# Patient Record
Sex: Male | Born: 1960 | Race: Black or African American | Hispanic: No | Marital: Single | Smoking: Never smoker
Health system: Southern US, Community
[De-identification: ages and names within clinical notes are randomized; demographics above are authoritative.]

## PROBLEM LIST (undated history)

## (undated) DIAGNOSIS — N529 Male erectile dysfunction, unspecified: Secondary | ICD-10-CM

## (undated) DIAGNOSIS — A6000 Herpesviral infection of urogenital system, unspecified: Secondary | ICD-10-CM

## (undated) HISTORY — DX: Herpesviral infection of urogenital system, unspecified: A60.00

## (undated) HISTORY — DX: Male erectile dysfunction, unspecified: N52.9

---

## 2021-06-16 ENCOUNTER — Other Ambulatory Visit: Payer: Self-pay

## 2021-06-16 ENCOUNTER — Other Ambulatory Visit: Payer: Self-pay | Admitting: Internal Medicine

## 2021-06-16 DIAGNOSIS — R609 Edema, unspecified: Secondary | ICD-10-CM

## 2021-06-18 ENCOUNTER — Other Ambulatory Visit: Payer: Self-pay

## 2021-06-18 ENCOUNTER — Emergency Department (HOSPITAL_COMMUNITY): Payer: 59

## 2021-06-18 ENCOUNTER — Encounter (HOSPITAL_COMMUNITY): Payer: Self-pay | Admitting: Emergency Medicine

## 2021-06-18 ENCOUNTER — Emergency Department (HOSPITAL_COMMUNITY)
Admission: EM | Admit: 2021-06-18 | Discharge: 2021-06-18 | Disposition: A | Discharge status: Home / Self Care | Payer: 59 | Source: Home / Self Care | Attending: Emergency Medicine | Admitting: Emergency Medicine

## 2021-06-18 DIAGNOSIS — K115 Sialolithiasis: Secondary | ICD-10-CM | POA: Diagnosis present

## 2021-06-18 DIAGNOSIS — Z20822 Contact with and (suspected) exposure to covid-19: Secondary | ICD-10-CM | POA: Diagnosis present

## 2021-06-18 DIAGNOSIS — I889 Nonspecific lymphadenitis, unspecified: Secondary | ICD-10-CM | POA: Insufficient documentation

## 2021-06-18 DIAGNOSIS — K1120 Sialoadenitis, unspecified: Secondary | ICD-10-CM

## 2021-06-18 DIAGNOSIS — M542 Cervicalgia: Secondary | ICD-10-CM | POA: Diagnosis not present

## 2021-06-18 DIAGNOSIS — Z87891 Personal history of nicotine dependence: Secondary | ICD-10-CM

## 2021-06-18 DIAGNOSIS — R710 Precipitous drop in hematocrit: Secondary | ICD-10-CM | POA: Diagnosis not present

## 2021-06-18 DIAGNOSIS — K1121 Acute sialoadenitis: Principal | ICD-10-CM | POA: Diagnosis present

## 2021-06-18 LAB — BASIC METABOLIC PANEL
Anion gap: 8 (ref 5–15)
BUN: 9 mg/dL (ref 6–20)
CO2: 23 mmol/L (ref 22–32)
Calcium: 8.7 mg/dL — ABNORMAL LOW (ref 8.9–10.3)
Chloride: 107 mmol/L (ref 98–111)
Creatinine, Ser: 1.04 mg/dL (ref 0.61–1.24)
GFR, Estimated: >60 mL/min (ref >60)
Glucose, Bld: 104 mg/dL — ABNORMAL HIGH (ref 70–99)
Potassium: 3.6 mmol/L (ref 3.5–5.1)
Sodium: 138 mmol/L (ref 135–145)

## 2021-06-18 LAB — CBC WITH DIFFERENTIAL/PLATELET
Abs Immature Granulocytes: 0.03 10*3/uL (ref 0.00–0.07)
Basophils Absolute: 0 10*3/uL (ref 0.0–0.1)
Basophils Relative: 0 %
Eosinophils Absolute: 0.1 10*3/uL (ref 0.0–0.5)
Eosinophils Relative: 1 %
HCT: 39 % (ref 39.0–52.0)
Hemoglobin: 12.8 g/dL — ABNORMAL LOW (ref 13.0–17.0)
Immature Granulocytes: 0 %
Lymphocytes Relative: 25 %
Lymphs Abs: 2.2 10*3/uL (ref 0.7–4.0)
MCH: 31.3 pg (ref 26.0–34.0)
MCHC: 32.8 g/dL (ref 30.0–36.0)
MCV: 95.4 fL (ref 80.0–100.0)
Monocytes Absolute: 0.9 10*3/uL (ref 0.1–1.0)
Monocytes Relative: 10 %
Neutro Abs: 5.4 10*3/uL (ref 1.7–7.7)
Neutrophils Relative %: 64 %
Platelets: 233 10*3/uL (ref 150–400)
RBC: 4.09 MIL/uL — ABNORMAL LOW (ref 4.22–5.81)
RDW: 12 % (ref 11.5–15.5)
WBC: 8.6 10*3/uL (ref 4.0–10.5)
nRBC: 0 % (ref 0.0–0.2)

## 2021-06-18 MED ORDER — HYDROMORPHONE HCL 1 MG/ML IJ SOLN
1.0000 mg | Freq: Once | INTRAMUSCULAR | Status: AC
Start: 2021-06-18 — End: 2021-06-18
  Administered 2021-06-18: 1 mg via INTRAVENOUS
  Filled 2021-06-18: qty 1

## 2021-06-18 MED ORDER — HYDROCODONE-ACETAMINOPHEN 7.5-325 MG PO TABS: 1.0000 | ORAL_TABLET | Freq: Four times a day (QID) | ORAL | 0 refills | Status: DC | PRN

## 2021-06-18 MED ORDER — SODIUM CHLORIDE 0.9 % IV BOLUS
500.0000 mL | Freq: Once | INTRAVENOUS | Status: AC
  Administered 2021-06-18: 500 mL via INTRAVENOUS

## 2021-06-18 MED ORDER — MORPHINE SULFATE (PF) 4 MG/ML IV SOLN
4.0000 mg | Freq: Once | INTRAVENOUS | Status: AC
Start: 2021-06-18 — End: 2021-06-18
  Administered 2021-06-18: 4 mg via INTRAVENOUS
  Filled 2021-06-18: qty 1

## 2021-06-18 MED ORDER — AMOXICILLIN-POT CLAVULANATE 875-125 MG PO TABS: 1.0000 | ORAL_TABLET | Freq: Two times a day (BID) | ORAL | 0 refills | Status: DC

## 2021-06-18 MED ORDER — HYDROMORPHONE HCL 1 MG/ML IJ SOLN
1.0000 mg | Freq: Once | INTRAMUSCULAR | Status: AC
  Administered 2021-06-18: 1 mg via INTRAVENOUS
  Filled 2021-06-18: qty 1

## 2021-06-18 MED ORDER — IOHEXOL 300 MG/ML  SOLN
75.0000 mL | Freq: Once | INTRAMUSCULAR | Status: AC | PRN
  Administered 2021-06-18: 75 mL via INTRAVENOUS

## 2021-06-18 NOTE — ED Triage Notes (Signed)
Patient reports swelling of right neck lymph node onset 2 months ago with right ear ache , no fever or chills .

## 2021-06-18 NOTE — ED Provider Notes (Signed)
[facility] EMERGENCY DEPARTMENT Provider Note   CSN: [id number] Arrival date & time: 06/18/21  0600     History Chief Complaint  Patient presents with   Lymphadenitis    Dustin Powell is a 60 y.o. male.  HPI  60 year old male presents the emergency department with swelling underneath the right jaw.  Patient states that this started a week ago on Friday, the swelling has gotten worse, more tender.  He is now having pain radiating to his lower jaw/teeth, right ear, neck and temple area.  Denies any recent dental infection.  No recent fever.  He states it is painful to swallow but does not feel any particular throat swelling.  History reviewed. No pertinent past medical history.  There are no problems to display for this patient.   History reviewed. No pertinent surgical history.     No family history on file.  Social History   Tobacco Use   Smoking status: Never   Smokeless tobacco: Never  Substance Use Topics   Alcohol use: Yes   Drug use: Never    Home Medications Prior to Admission medications   Not on File    Allergies    Patient has no known allergies.  Review of Systems   Review of Systems  Constitutional:  Positive for appetite change. Negative for chills and fever.  HENT:  Positive for ear pain, sore throat and trouble swallowing. Negative for congestion, dental problem and voice change.   Eyes:  Negative for visual disturbance.  Respiratory:  Negative for shortness of breath.   Cardiovascular:  Negative for chest pain.  Gastrointestinal:  Negative for abdominal pain, diarrhea and vomiting.  Genitourinary:  Negative for dysuria.  Skin:  Negative for rash.  Neurological:  Negative for headaches.   Physical Exam Updated Vital Signs BP 136/88   Pulse 65   Temp 98 F (36.7 C) (Oral)   Resp 16   Ht 5\' 6"  (1.676 m)   Wt 80 kg   SpO2 94%   BMI 28.47 kg/m   Physical Exam Vitals and nursing note reviewed.  Constitutional:       Appearance: Normal appearance.  HENT:     Head: Normocephalic.     Mouth/Throat:     Mouth: Mucous membranes are moist.     Comments: Poor dentition throughout the entire mouth Eyes:     Pupils: Pupils are equal, round, and reactive to light.  Neck:     Comments: Right submandibular firm tender swelling extending from the jawline to about the mid neck, no stridor Cardiovascular:     Rate and Rhythm: Normal rate.  Pulmonary:     Effort: Pulmonary effort is normal. No respiratory distress.  Abdominal:     Palpations: Abdomen is soft.     Tenderness: There is no abdominal tenderness.  Skin:    General: Skin is warm.  Neurological:     Mental Status: He is alert and oriented to person, place, and time. Mental status is at baseline.  Psychiatric:        Mood and Affect: Mood normal.    ED Results / Procedures / Treatments   Labs (all labs ordered are listed, but only abnormal results are displayed) Labs Reviewed  CBC WITH DIFFERENTIAL/PLATELET - Abnormal; Notable for the following components:      Result Value   RBC 4.09 (*)    Hemoglobin 12.8 (*)    All other components within normal limits  BASIC METABOLIC PANEL - Abnormal; Notable  for the following components:   Glucose, Bld 104 (*)    Calcium 8.7 (*)    All other components within normal limits    EKG None  Radiology No results found.  Procedures Procedures   Medications Ordered in ED Medications  sodium chloride 0.9 % bolus 500 mL (500 mLs Intravenous New Bag/Given 06/18/21 1013)  morphine 4 MG/ML injection 4 mg (4 mg Intravenous Given 06/18/21 1013)    ED Course  I have reviewed the triage vital signs and the nursing notes.  Pertinent labs & imaging results that were available during my care of the patient were reviewed by me and considered in my medical decision making (see chart for details).    MDM Rules/Calculators/A&P                           60 year old male presents the emergency department  with right submandibular swelling.  Vitals are stable, blood work is reassuring.  CT scan shows a large submandibular gland stone at 1.9 cm with associated sialadenitis.  Patient has not have any specialized follow-up for this.  Spoke with on-call ENT Dr. [name] who recommends antibiotics and pain control and outpatient follow-up in the office for possible stone removal/plan removal.  Patient and spouse agree with this discharge plan.  Consulted with pharmacy for appropriate coverage for sialadenitis, we will prescribe Augmentin to start.  Patient at this time appears safe and stable for discharge and will be treated as an outpatient.  Discharge plan and strict return to ED precautions discussed, patient verbalizes understanding and agreement.  Final Clinical Impression(s) / ED Diagnoses Final diagnoses:  None    Rx / DC Orders ED Discharge Orders     None        Rozelle Logan, DO 06/18/21 1520

## 2021-06-18 NOTE — Discharge Instructions (Addendum)
You have been seen and discharged from the emergency department.  Take antibiotic as directed, take pain medicine as needed.  Do not mix this medication with alcohol or other sedating medications. Do not drive or do heavy physical activity and to know how this medication affects you.  It may cause drowsiness.  Follow-up with Dr. [name], ENT, for reevaluation and further care. Take home medications as prescribed. If you have any worsening symptoms or further concerns for your health please return to an emergency department for further evaluation.

## 2021-06-19 ENCOUNTER — Encounter (HOSPITAL_COMMUNITY): Payer: Self-pay | Admitting: Emergency Medicine

## 2021-06-19 ENCOUNTER — Inpatient Hospital Stay (HOSPITAL_COMMUNITY)
Admission: EM | Admit: 2021-06-19 | Discharge: 2021-06-22 | DRG: 155 | Disposition: A | Discharge status: Home / Self Care | Payer: 59 | Attending: Family Medicine | Admitting: Family Medicine

## 2021-06-19 DIAGNOSIS — K1120 Sialoadenitis, unspecified: Secondary | ICD-10-CM | POA: Diagnosis not present

## 2021-06-19 DIAGNOSIS — K1121 Acute sialoadenitis: Secondary | ICD-10-CM | POA: Diagnosis present

## 2021-06-19 DIAGNOSIS — R710 Precipitous drop in hematocrit: Secondary | ICD-10-CM | POA: Diagnosis not present

## 2021-06-19 DIAGNOSIS — K115 Sialolithiasis: Secondary | ICD-10-CM

## 2021-06-19 DIAGNOSIS — Z87891 Personal history of nicotine dependence: Secondary | ICD-10-CM | POA: Diagnosis not present

## 2021-06-19 DIAGNOSIS — M542 Cervicalgia: Secondary | ICD-10-CM

## 2021-06-19 DIAGNOSIS — R509 Fever, unspecified: Secondary | ICD-10-CM

## 2021-06-19 DIAGNOSIS — R221 Localized swelling, mass and lump, neck: Secondary | ICD-10-CM | POA: Diagnosis not present

## 2021-06-19 DIAGNOSIS — Z20822 Contact with and (suspected) exposure to covid-19: Secondary | ICD-10-CM | POA: Diagnosis present

## 2021-06-19 LAB — CBC WITH DIFFERENTIAL/PLATELET
Abs Immature Granulocytes: 0.07 10*3/uL (ref 0.00–0.07)
Basophils Absolute: 0 10*3/uL (ref 0.0–0.1)
Basophils Relative: 0 %
Eosinophils Absolute: 0 10*3/uL (ref 0.0–0.5)
Eosinophils Relative: 0 %
HCT: 41.2 % (ref 39.0–52.0)
Hemoglobin: 14.1 g/dL (ref 13.0–17.0)
Immature Granulocytes: 1 %
Lymphocytes Relative: 11 %
Lymphs Abs: 1.5 10*3/uL (ref 0.7–4.0)
MCH: 31.8 pg (ref 26.0–34.0)
MCHC: 34.2 g/dL (ref 30.0–36.0)
MCV: 93 fL (ref 80.0–100.0)
Monocytes Absolute: 1 10*3/uL (ref 0.1–1.0)
Monocytes Relative: 7 %
Neutro Abs: 11.6 10*3/uL — ABNORMAL HIGH (ref 1.7–7.7)
Neutrophils Relative %: 81 %
Platelets: 260 10*3/uL (ref 150–400)
RBC: 4.43 MIL/uL (ref 4.22–5.81)
RDW: 11.9 % (ref 11.5–15.5)
WBC: 14.2 10*3/uL — ABNORMAL HIGH (ref 4.0–10.5)
nRBC: 0 % (ref 0.0–0.2)

## 2021-06-19 LAB — BASIC METABOLIC PANEL
Anion gap: 12 (ref 5–15)
BUN: 11 mg/dL (ref 6–20)
CO2: 22 mmol/L (ref 22–32)
Calcium: 9.2 mg/dL (ref 8.9–10.3)
Chloride: 102 mmol/L (ref 98–111)
Creatinine, Ser: 1.01 mg/dL (ref 0.61–1.24)
GFR, Estimated: >60 mL/min (ref >60)
Glucose, Bld: 103 mg/dL — ABNORMAL HIGH (ref 70–99)
Potassium: 3.8 mmol/L (ref 3.5–5.1)
Sodium: 136 mmol/L (ref 135–145)

## 2021-06-19 LAB — RESP PANEL BY RT-PCR (FLU A&B, COVID) ARPGX2
Influenza A by PCR: NEGATIVE
Influenza B by PCR: NEGATIVE
SARS Coronavirus 2 by RT PCR: NEGATIVE

## 2021-06-19 LAB — HIV ANTIBODY (ROUTINE TESTING W REFLEX): HIV Screen 4th Generation wRfx: NONREACTIVE

## 2021-06-19 MED ORDER — SODIUM CHLORIDE 0.9 % IV SOLN: Freq: Once | INTRAVENOUS | Status: AC

## 2021-06-19 MED ORDER — ACETAMINOPHEN 160 MG/5ML PO SOLN: 650.0000 mg | Freq: Four times a day (QID) | ORAL | Status: DC

## 2021-06-19 MED ORDER — HYDROMORPHONE HCL 1 MG/ML IJ SOLN
2.0000 mg | Freq: Four times a day (QID) | INTRAMUSCULAR | Status: DC | PRN
  Administered 2021-06-19: 2 mg via INTRAVENOUS
  Filled 2021-06-19: qty 2

## 2021-06-19 MED ORDER — SODIUM CHLORIDE 0.9 % IV SOLN: INTRAVENOUS | Status: DC

## 2021-06-19 MED ORDER — SODIUM CHLORIDE 0.9 % IV BOLUS
1000.0000 mL | Freq: Once | INTRAVENOUS | Status: AC
  Administered 2021-06-19: 1000 mL via INTRAVENOUS

## 2021-06-19 MED ORDER — HYDROMORPHONE HCL 1 MG/ML IJ SOLN
INTRAMUSCULAR | Status: AC
  Administered 2021-06-19: 2 mg
  Filled 2021-06-19: qty 2

## 2021-06-19 MED ORDER — ACETAMINOPHEN 160 MG/5ML PO SOLN
650.0000 mg | Freq: Four times a day (QID) | ORAL | Status: DC | PRN
  Administered 2021-06-20 – 2021-06-21 (×4): 650 mg via ORAL
  Filled 2021-06-19 (×4): qty 20.3

## 2021-06-19 MED ORDER — SODIUM CHLORIDE 0.9 % IV SOLN
3.0000 g | Freq: Four times a day (QID) | INTRAVENOUS | Status: DC
  Administered 2021-06-19 – 2021-06-22 (×11): 3 g via INTRAVENOUS
  Filled 2021-06-19 (×11): qty 8

## 2021-06-19 MED ORDER — ENOXAPARIN SODIUM 40 MG/0.4ML IJ SOSY
40.0000 mg | PREFILLED_SYRINGE | INTRAMUSCULAR | Status: DC
  Administered 2021-06-19 – 2021-06-21 (×3): 40 mg via SUBCUTANEOUS
  Filled 2021-06-19 (×3): qty 0.4

## 2021-06-19 MED ORDER — AMOXICILLIN-POT CLAVULANATE 875-125 MG PO TABS
1.0000 | ORAL_TABLET | Freq: Two times a day (BID) | ORAL | Status: DC
  Administered 2021-06-19: 1 via ORAL
  Filled 2021-06-19: qty 1

## 2021-06-19 MED ORDER — ONDANSETRON HCL 4 MG/2ML IJ SOLN
4.0000 mg | Freq: Once | INTRAMUSCULAR | Status: AC
  Administered 2021-06-19: 4 mg via INTRAVENOUS
  Filled 2021-06-19: qty 2

## 2021-06-19 MED ORDER — HYDROMORPHONE HCL 1 MG/ML IJ SOLN
2.0000 mg | INTRAMUSCULAR | Status: DC | PRN
  Administered 2021-06-19: 2 mg via INTRAVENOUS
  Filled 2021-06-19: qty 2

## 2021-06-19 MED ORDER — HYDROMORPHONE HCL 1 MG/ML IJ SOLN
1.0000 mg | Freq: Once | INTRAMUSCULAR | Status: AC
  Administered 2021-06-19: 1 mg via INTRAVENOUS
  Filled 2021-06-19: qty 1

## 2021-06-19 MED ORDER — ACETAMINOPHEN 325 MG PO TABS
650.0000 mg | ORAL_TABLET | Freq: Four times a day (QID) | ORAL | Status: DC | PRN
  Filled 2021-06-19: qty 2

## 2021-06-19 NOTE — Progress Notes (Signed)
Family Medicine Teaching Service Daily Progress Note Intern Pager: [phone]  Patient name: Dustin Powell Medical record number: [id number] Date of birth: [birth year] Age: 60 y.o. Gender: male  Primary Care Provider: Default, Provider, MD Consultants: ENT   Code Status: Full Code  Pt Overview and Major Events to Date:  9/24-admitted  Assessment and Plan: Dustin Powell is a 60 y.o. male presenting with 2 months of jaw pain found to have sialadenitis admitted for pain control and inability tolerate oral intake. PMHx significant for: None.  Sialadenitis 1.9 cm submandibular stone seen on CT. Awaiting ENT intervention tomorrow.  Pain is well controlled on current regimen. - appreciate ENT involvement - IV ampicillin-sulbactam - IV hydromorphone 2 mg every 4 hours as needed - Acetaminophen oral solution prn - mIVF - reached out to RN to perform bedside swallow  Upper respiratory symptoms Symptoms of cough, congestion, rhinorrhea.  COVID-negative.  Airborne precautions discontinued.  He reports cough is now productive.  Fever With leukocytosis WBC 14.6, no significant change from yesterday.  May be infected sialadenitis or pneumonia given productive cough.  Ordering cultures and CXR. - blood cx - urine cx - CXR - will reach out to ENT  Tachycardia He had brief episode of tachycardia with HR 120 seen on monitors.  There was 1 PVC seen on the strip.  Suspect this may be related to underlying fever.  We will continue to monitor. - cardiac monitoring  Alcohol use Reportedly drinks 12-18 beers every other weekend.  States his last drink was last weekend and he had 2 beers.  Advised to be careful with his alcohol intake. - monitor CIWA, can consider discontinuing - consider TOC consult  FEN/GI: NPO, consider advancing if passes bedside swallow PPx: Enoxaparin  Disposition: MedSurg, dispo pending ENT evaluation  Subjective:  Patient was febrile to 102.3 F 2 hours prior.  He was  given Tylenol.  Patient states he was unaware that he was having a fever.  He reports he is feeling overall better as far as pain control.  He was able to take the Tylenol and this did help with the pain.  He states he is still having some congestion and runny nose and that his cough is now productive.  Denies SOB.  Objective: Temp:  [99 F (37.2 C)-102.9 F (39.4 C)] 102.9 F (39.4 C) (09/25 0440) Pulse Rate:  [56-98] 94 (09/25 0440) Resp:  [16-20] 18 (09/24 2123) BP: (112-160)/(54-103) 129/81 (09/25 0440) SpO2:  [91 %-99 %] 92 % (09/25 0440) Physical Exam: General: Alert, NAD HEENT: MMM, there is submandibular swelling and tenderness on the right side, airway is intact Cardiovascular: Tachycardic, regular rhythm, no murmurs Respiratory: Clear to auscultation bilaterally, no respiratory distress Abdomen: Soft, nontender, positive bowel sounds Extremities: Warm and well perfused, no edema  Laboratory: Recent Labs  Lab 06/18/21 0618 06/19/21 0826 06/20/21 0156  WBC 8.6 14.2* 14.6*  HGB 12.8* 14.1 12.8*  HCT 39.0 41.2 38.4*  PLT 233 260 234   Recent Labs  Lab 06/18/21 0618 06/19/21 0826 06/20/21 0156  NA 138 136 137  K 3.6 3.8 3.8  CL 107 102 103  CO2 23 22 24   BUN 9 11 12   CREATININE 1.04 1.01 1.13  CALCIUM 8.7* 9.2 8.6*  GLUCOSE 104* 103* 111*      Imaging/Diagnostic Tests: CXR pending  , MD 06/20/2021, 6:33 AM PGY-2, [facility] FPTS Intern pager: [phone], text pages welcome

## 2021-06-19 NOTE — H&P (Addendum)
Family Medicine Teaching [facility] Admission History and Physical Service Pager: [phone]  Patient name: Dustin Powell Medical record number: [id number] Date of birth: 24-Sep-1961 Age: 60 y.o. Gender: male  Primary Care Provider: Default, Provider, MD Consultants: ENT Code Status: Full  Preferred Emergency Contact: Renelda Loma (fiance) [phone] Contact Information     Name Relation Home Work Mobile   baldwin,sherri Significant other   [phone]       Chief Complaint: Jaw Pain   Assessment and Plan: Dustin Powell is a 60 y.o. male presenting with jaw pain onset approximately 2 months ago. Patient has no relevant PMHx.   Jaw Pain 2/2 submandibular stone and sialadenitis  Pain and swelling in the right jaw began 2 months ago and worsened over the last week. The pt went to the ED on 9/23 for pain and swelling. He was diagnosed with a 1.9 cm submandibular stone with associated sialadenitis without abscess formation on CT scan. On-call ENT, Dr. [name], was called at that time and recommended antibiotics and pain control as well as outpatient follow-up for stone removal due to the nonemergent nature of this problem.  ENT will see the patient on Monday and recommended that primary team does not need to notify ENT unless acute changes occur as they are aware of the pt, per ED provider.  After visit to the ED yesterday, the patient was given Augmentin.  Patient returned to the ED today 9/24 because he could not tolerate any medications that were given to him and could not take anything p.o. secondary to pain.  Patient has remained afebrile and vital signs have been relatively stable.  Electrolytes are within normal limits, WBC 14.2 from 8.6.   In the ED, he received dilaudid 1mg  x2 with last dose at 1100, zofran, and a bolus of NS. Will admit for IV antibiotics and pain control until potential ENT intervention.  -Admit to med-surg, Attending Dr.  -ENT consulted, appreciate  recommendations -VS per floor protocol  -Dilaudid 2mg  for pain q6hr PRN -Unasyn per pharmacy  -Tylenol scheduled q6hr  -SLP to eval and treat  -Maintenance fluids at 100 mL/hour -Cardiac monitoring -NPO pending SLP eval -continue to monitor clinically   Leukocytosis WBC 14.2 on admission. Likely etiology seems to be due to sialadenitis. Reassuringly patient remains afebrile. -monitor am CBC -consider blood and urine cx along with CXR if becomes febrile   Upper Respiratory Symptoms  Patient has had rhinorrhea, congestion, and cough that started today.  He has had no sick contacts.  Denies any sore throat, shortness of breath.  Patient is afebrile.  -COVID/resp panel pending -Airborne and contact precautions  Alcohol use Patient reports that he drinks 12-18 beers over every other weekend.  We will monitor this.  -monitor CIWAs -Possibly contact TOC for resources if warranted   FEN/GI: NPO pending SLP eval Prophylaxis: Lovenox   Disposition: admit to med surg, attending Dr. Lum Babe   History of Present Illness:  Dustin Powell is a 60 y.o. male presenting with right sided jaw pain onset 2 months ago and worsening over the last week.  He is also endorsing rhinorrhea, congestion and cough onset over the last day. He noticed a neck mass that started about 2 months ago, and it has been growing steadily larger since then but much worse over this past week. Denies any dyspnea. Previously saw PCP in [place] who sent him to get imaging. PCP recommended ibuprofen for pain, over the last few weeks he has been taking 4 pills  a day. He has not eaten in 2 days due to the pain which he rates a 9/10. He is able to drink appropriately. Describes the pain as intense, sharp pain that radiates to his right ear. Former smoker but quit 3 years ago. Denies nausea, vomiting, diarrhea, constipation, sore throat. Came to the ED for evaluation regarding similar complaint yesterday, they recommended more fluids  and rest. He was also given prescription for pain medication. He returned today to the ED due to inability to eat from the pain. Denies hemoptysis or drainage from the area. Endorses that he feels better after coming to the hospital. Denies any recent sick contacts. Previously smoked a pack every 2-3 days, for 3-4 years. Occasional consumption of alcohol on weekends or special occasions, drinks about a 12 or 18 pack of beer about every other weekend. Denies illicit drug use.   Review Of Systems: Per HPI with the following additions:   Review of Systems  Constitutional:  Negative for chills.       Subjective fever  HENT:  Positive for congestion, ear pain and trouble swallowing. Negative for voice change.        Radiating right ear pain from right jaw into the cheek    Respiratory:  Positive for cough. Negative for shortness of breath.   Cardiovascular:  Negative for chest pain.  Gastrointestinal:  Negative for abdominal pain.  Neurological:  Positive for headaches (right sided HA).    Patient Active Problem List   Diagnosis Date Noted   Neck mass 06/19/2021    Past Medical History: History reviewed. No pertinent past medical history.  Past Surgical History: History reviewed. No pertinent surgical history.  Social History: Social History   Tobacco Use   Smoking status: Never   Smokeless tobacco: Never  Substance Use Topics   Alcohol use: Yes   Drug use: Never   Additional social history: Previously smoked a pack every 2-3 days, for 3-4 years. Occasional consumption of alcohol on weekends or special occasions, drinks about a 12 or 18 pack of beer about every other weekend. Denies illicit drug use.   Please also refer to relevant sections of EMR.  Family History: No family history on file. (None)  Allergies and Medications: No Known Allergies No current facility-administered medications on file prior to encounter.   Current Outpatient Medications on File Prior to Encounter   Medication Sig Dispense Refill   amoxicillin-clavulanate (AUGMENTIN) 875-125 MG tablet Take 1 tablet by mouth every 12 (twelve) hours. 14 tablet 0   HYDROcodone-acetaminophen (NORCO) 7.5-325 MG tablet Take 1 tablet by mouth every 6 (six) hours as needed for up to 5 days for moderate pain. 20 tablet 0    Objective: BP 133/82 (BP Location: Left Arm)   Pulse 71   Temp 99.7 F (37.6 C) (Oral)   Resp 18   SpO2 95%  Physical Exam Constitutional:      General: He is not in acute distress. HENT:     Ears:     Comments: Pain in the right ear radiating from the right chin to cheek. Right sided submandibular swelling.     Nose: Congestion and rhinorrhea present.  Eyes:     Pupils: Pupils are equal, round, and reactive to light.  Cardiovascular:     Rate and Rhythm: Normal rate and regular rhythm.     Pulses: Normal pulses.     Heart sounds: No murmur heard. Pulmonary:     Effort: Pulmonary effort is normal. No respiratory distress.  Breath sounds: Normal breath sounds.  Abdominal:     General: Bowel sounds are normal. There is no distension.     Palpations: Abdomen is soft.     Tenderness: There is no abdominal tenderness.  Skin:    General: Skin is warm.  Neurological:     Mental Status: He is alert and oriented to person, place, and time.  Psychiatric:        Mood and Affect: Mood normal.        Behavior: Behavior normal.     Labs and Imaging: CBC BMET  Recent Labs  Lab 06/19/21 0826  WBC 14.2*  HGB 14.1  HCT 41.2  PLT 260   Recent Labs  Lab 06/19/21 0826  NA 136  K 3.8  CL 102  CO2 22  BUN 11  CREATININE 1.01  GLUCOSE 103*  CALCIUM 9.2     CT soft tissue neck: IMPRESSION: 1. Large right submandibular stone measuring up to 1.9 cm with multiple additional smaller stones. There is associated enlargement and heterogeneous enhancement of the gland with surrounding inflammatory change and free fluid consistent with sialadenitis. 2. No evidence of abscess  formation in the neck. 3. Extensive dental disease as above. Recommend nonemergent dental consultation.     Alfredo Martinez, MD 06/19/2021, 3:00 PM PGY-1, [facility] FPTS Intern pager: [phone], text pages welcome  I was personally present and performed or re-performed the history, physical exam and medical decision making activities of this service and have verified that the service and findings are accurately documented in the resident's note. My edits are noted within the note. Please also see attending's attestation and notes.  Reece Leader, DO                  06/19/2021, 5:13 PM  PGY-2, [facility]

## 2021-06-19 NOTE — ED Notes (Signed)
C/o swelling to the right lower jaw, states he was seen her yest for same and has been taking medication as directed however states its not getting better.

## 2021-06-19 NOTE — Progress Notes (Addendum)
Pharmacy Antibiotic Note  Dustin Powell is a 60 y.o. male admitted on 06/19/2021 with  tonsil stone .  Pharmacy has been consulted for unasyn dosing.  Had pain/swelling in R jaw beginning 2 months ago and worsening over last week. Diagnosed with 1.9 cm submandibular stone with associated sialadenitis (w/o abscess) - was started on augmentin yesterday but unable to tolerate PO medications. WBC 14.2, Scr 1.01 (CrCl 77 mL/min). Tmax 99.   Plan: Unasyn 3g IV every 6 hours Monitor renal fx, cx results, clinical pic, and LOT     Temp (24hrs), Avg:99.4 F (37.4 C), Min:99 F (37.2 C), Max:99.9 F (37.7 C)  Recent Labs  Lab 06/18/21 0618 06/19/21 0826  WBC 8.6 14.2*  CREATININE 1.04 1.01    Estimated Creatinine Clearance: 77.3 mL/min (by C-G formula based on SCr of 1.01 mg/dL).    No Known Allergies  Antimicrobials this admission: Unasyn 9/24 >>   Dose adjustments this admission: N/A  Microbiology results: 9/24 COVID PCR: neg   Thank you for allowing pharmacy to be a part of this patient's care.  Sherron Monday, PharmD, BCCCP Clinical Pharmacist  Phone: [phone] 06/19/2021 5:30 PM  Please check AMION for all [facility] Pharmacy phone numbers After 10:00 PM, call Main Pharmacy [phone]

## 2021-06-19 NOTE — ED Notes (Signed)
States it still hurts however pain is much more tolerable at present.

## 2021-06-19 NOTE — ED Notes (Signed)
Given supplemental O2 after receiving dilaudid, and while sleeping. Alert, NAD, calm. Reports pain improved. Denies nausea or other sx. CBIR. Pending bed assignment.

## 2021-06-19 NOTE — ED Notes (Signed)
Pt did not tolerate PO medication. Pt began dry heaving which increased pain. Was able to keep antibiotic down at this time

## 2021-06-19 NOTE — ED Triage Notes (Signed)
Pt continues to have swelling to his neck swelling.  No fever, hurts to swallow but able to speak in complete sentences.

## 2021-06-19 NOTE — ED Provider Notes (Signed)
Emergency Medicine Provider Triage Evaluation Note  Dustin Powell , a 60 y.o. male  was evaluated in triage.  Pt complains of persistent pain under his R jaw. Diagnosed with 1.9cm salivary stone yesterday. Discharged with Augmentin and pain medication, but states that it is difficult to swallow these medications due to pain. No fevers since discharge. Was given instruction to follow up with Dr. [name], but never contacted the office today for follow up.  Review of Systems  Positive: R submandibular swelling Negative: Fever  Physical Exam  BP (!) 175/93 (BP Location: Left Arm)   Pulse 82   Temp 99.1 F (37.3 C)   Resp 17   SpO2 97%  Gen:   Awake, no distress   Resp:  Normal effort  MSK:   Moves extremities without difficulty  Other:  R submandibular swelling. Tolerating secretions. Normal phonation.  Medical Decision Making  Medically screening exam initiated at 12:35 AM.  Appropriate orders placed.  Oleg Oleson was informed that the remainder of the evaluation will be completed by another provider, this initial triage assessment does not replace that evaluation, and the importance of remaining in the ED until their evaluation is complete.  Odynophagia; R salivary stone   Antony Madura, PA-C 06/19/21 3419    Marily Memos, MD 06/20/21 0030

## 2021-06-19 NOTE — ED Provider Notes (Signed)
[facility] EMERGENCY DEPARTMENT Provider Note   CSN: [id number] Arrival date & time: 06/18/21  2346     History Chief Complaint  Patient presents with   Neck Pain    Dustin Powell is a 60 y.o. male.  HPI   60 year old male presents to the ER with worsening right sided neck pain and swelling.  I saw this patient yesterday in the ER when he was diagnosed with a large right-sided salivary gland stone with sialoadenitis.  ENT recommended antibiotics, pain control and outpatient follow-up.  Patient states since being home he is been unable to tolerate the medications and anything p.o. secondary to pain.  Denies any fever.  Denies any throat swelling or voice change.  History reviewed. No pertinent past medical history.  There are no problems to display for this patient.   History reviewed. No pertinent surgical history.     No family history on file.  Social History   Tobacco Use   Smoking status: Never   Smokeless tobacco: Never  Substance Use Topics   Alcohol use: Yes   Drug use: Never    Home Medications Prior to Admission medications   Medication Sig Start Date End Date Taking? Authorizing Provider  amoxicillin-clavulanate (AUGMENTIN) 875-125 MG tablet Take 1 tablet by mouth every 12 (twelve) hours. 06/18/21   Kiani Wurtzel, Clabe Seal, DO  HYDROcodone-acetaminophen (NORCO) 7.5-325 MG tablet Take 1 tablet by mouth every 6 (six) hours as needed for up to 5 days for moderate pain. 06/18/21 06/23/21  Estanislao Harmon, Clabe Seal, DO    Allergies    Patient has no known allergies.  Review of Systems   Review of Systems  Constitutional:  Negative for chills and fever.  HENT:  Negative for congestion and voice change.        Throat pain and submandibular swelling  Eyes:  Negative for visual disturbance.  Respiratory:  Negative for shortness of breath.   Cardiovascular:  Negative for chest pain.  Gastrointestinal:  Negative for abdominal pain, diarrhea and vomiting.   Genitourinary:  Negative for dysuria.  Skin:  Negative for rash.  Neurological:  Negative for headaches.   Physical Exam Updated Vital Signs BP 138/88   Pulse 80   Temp 99 F (37.2 C) (Oral)   Resp 16   SpO2 95%   Physical Exam Vitals and nursing note reviewed.  Constitutional:      Appearance: Normal appearance.  HENT:     Head: Normocephalic.     Mouth/Throat:     Mouth: Mucous membranes are moist.     Comments: No intraoral swelling, no difficulty swallowing secretions Neck:     Comments: Right submandibular swelling that is firm and very tender to touch, no stridor Cardiovascular:     Rate and Rhythm: Normal rate.  Pulmonary:     Effort: Pulmonary effort is normal. No respiratory distress.  Abdominal:     Palpations: Abdomen is soft.     Tenderness: There is no abdominal tenderness.  Skin:    General: Skin is warm.  Neurological:     Mental Status: He is alert and oriented to person, place, and time. Mental status is at baseline.  Psychiatric:        Mood and Affect: Mood normal.    ED Results / Procedures / Treatments   Labs (all labs ordered are listed, but only abnormal results are displayed) Labs Reviewed  CBC WITH DIFFERENTIAL/PLATELET - Abnormal; Notable for the following components:  Result Value   WBC 14.2 (*)    Neutro Abs 11.6 (*)    All other components within normal limits  BASIC METABOLIC PANEL - Abnormal; Notable for the following components:   Glucose, Bld 103 (*)    All other components within normal limits    EKG None  Radiology CT Soft Tissue Neck W Contrast  Result Date: 06/18/2021 CLINICAL DATA:  Right neck swelling and pain for 2 weeks EXAM: CT NECK WITH CONTRAST TECHNIQUE: Multidetector CT imaging of the neck was performed using the standard protocol following the bolus administration of intravenous contrast. CONTRAST:  21mL OMNIPAQUE IOHEXOL 300 MG/ML  SOLN COMPARISON:  None. FINDINGS: Pharynx and larynx: The nasal cavity and  nasopharynx are normal. There is partial effacement of the right oropharyngeal airway by the below described sialolithiasis/sialadenitis. There is no mucosal lesion identified. The oral cavity and oropharynx are otherwise unremarkable. The hypopharynx and larynx are unremarkable. The parapharyngeal spaces are unremarkable. Salivary glands: There is a 1.9 cm x 1.7 cm by 1.6 cm sialolith in the right submandibular gland. Multiple additional smaller stones are also seen within the right submandibular gland. The submandibular gland is enlarged and heterogeneously enhancing compared to the right. There is no appreciable enlargement of the submandibular duct distally. There is mild inflammatory change and free fluid in the overlying/surrounding soft tissues. The parotid glands and left submandibular gland are unremarkable. Thyroid: Unremarkable. Lymph nodes: There is a 0.7 cm right level IB lymph node and a 1.3 cm right level IIA lymph node, nonspecific but likely reactive. There are scattered additional subcentimeter cervical chain lymph nodes bilaterally without evidence of pathologic lymphadenopathy. Vascular: The vasculature is unremarkable. Limited intracranial: The imaged portions of the intracranial compartment are unremarkable. Visualized orbits: Not evaluated. Mastoids and visualized paranasal sinuses: The imaged paranasal sinuses are clear. The imaged mastoid air cells are clear. Skeleton: There is mild multilevel degenerative change of the cervical spine, most advanced at C4-C5 and C5-C6. There is advanced degenerative change of the left temporomandibular joint. Upper chest: The lung apices are clear. Other: There is extensive dental disease with prominent destructive change involving the right maxillary alveolar ridge. There are dental caries and periapical lucencies involving nearly all remaining molars. IMPRESSION: 1. Large right submandibular stone measuring up to 1.9 cm with multiple additional smaller  stones. There is associated enlargement and heterogeneous enhancement of the gland with surrounding inflammatory change and free fluid consistent with sialadenitis. 2. No evidence of abscess formation in the neck. 3. Extensive dental disease as above. Recommend nonemergent dental consultation. Electronically Signed   By: Lesia Hausen M.D.   On: 06/18/2021 12:39    Procedures Procedures   Medications Ordered in ED Medications  sodium chloride 0.9 % bolus 1,000 mL (0 mLs Intravenous Stopped 06/19/21 0924)  HYDROmorphone (DILAUDID) injection 1 mg (1 mg Intravenous Given 06/19/21 0822)  ondansetron (ZOFRAN) injection 4 mg (4 mg Intravenous Given 06/19/21 7616)    ED Course  I have reviewed the triage vital signs and the nursing notes.  Pertinent labs & imaging results that were available during my care of the patient were reviewed by me and considered in my medical decision making (see chart for details).    MDM Rules/Calculators/A&P                           60 year old male presents with ongoing neck pain and difficulty swallowing 2/2 large right submandibular salivary gland stone and  sialadenitis. He was discharged yesterday for possible outpatient treatment but has bee unable to tolerate pills and control pain. VSS, swelling seems comparable to when I saw him yesterday. Airway appears unaffected. Plan for ENT re consultation and medical admission.   Final Clinical Impression(s) / ED Diagnoses Final diagnoses:  None    Rx / DC Orders ED Discharge Orders     None        Rozelle Logan, DO 06/19/21 1016

## 2021-06-20 ENCOUNTER — Inpatient Hospital Stay (HOSPITAL_COMMUNITY): Payer: 59

## 2021-06-20 ENCOUNTER — Encounter (HOSPITAL_COMMUNITY): Payer: Self-pay | Admitting: Family Medicine

## 2021-06-20 DIAGNOSIS — K1120 Sialoadenitis, unspecified: Secondary | ICD-10-CM | POA: Diagnosis not present

## 2021-06-20 DIAGNOSIS — K115 Sialolithiasis: Secondary | ICD-10-CM | POA: Diagnosis not present

## 2021-06-20 DIAGNOSIS — M542 Cervicalgia: Secondary | ICD-10-CM

## 2021-06-20 DIAGNOSIS — R509 Fever, unspecified: Secondary | ICD-10-CM | POA: Diagnosis not present

## 2021-06-20 LAB — CBC
HCT: 38.4 % — ABNORMAL LOW (ref 39.0–52.0)
Hemoglobin: 12.8 g/dL — ABNORMAL LOW (ref 13.0–17.0)
MCH: 31.7 pg (ref 26.0–34.0)
MCHC: 33.3 g/dL (ref 30.0–36.0)
MCV: 95 fL (ref 80.0–100.0)
Platelets: 234 10*3/uL (ref 150–400)
RBC: 4.04 MIL/uL — ABNORMAL LOW (ref 4.22–5.81)
RDW: 11.9 % (ref 11.5–15.5)
WBC: 14.6 10*3/uL — ABNORMAL HIGH (ref 4.0–10.5)
nRBC: 0 % (ref 0.0–0.2)

## 2021-06-20 LAB — BASIC METABOLIC PANEL
Anion gap: 10 (ref 5–15)
BUN: 12 mg/dL (ref 6–20)
CO2: 24 mmol/L (ref 22–32)
Calcium: 8.6 mg/dL — ABNORMAL LOW (ref 8.9–10.3)
Chloride: 103 mmol/L (ref 98–111)
Creatinine, Ser: 1.13 mg/dL (ref 0.61–1.24)
GFR, Estimated: >60 mL/min (ref >60)
Glucose, Bld: 111 mg/dL — ABNORMAL HIGH (ref 70–99)
Potassium: 3.8 mmol/L (ref 3.5–5.1)
Sodium: 137 mmol/L (ref 135–145)

## 2021-06-20 MED ORDER — PHENOL 1.4 % MT LIQD
1.0000 | OROMUCOSAL | Status: DC | PRN
  Administered 2021-06-22: 1 via OROMUCOSAL
  Filled 2021-06-20: qty 177

## 2021-06-20 NOTE — Progress Notes (Signed)
   06/20/21 0440  Assess: MEWS Score  Temp (!) 102.9 F (39.4 C)  BP 129/81  Pulse Rate 94  SpO2 92 %  O2 Device Room Air  Assess: MEWS Score  MEWS Temp 2  MEWS Systolic 0  MEWS Pulse 0  MEWS RR 0  MEWS LOC 0  MEWS Score 2  MEWS Score Color Yellow  Assess: if the MEWS score is Yellow or Red  Were vital signs taken at a resting state? Yes  Focused Assessment Change from prior assessment (see assessment flowsheet)  Early Detection of Sepsis Score *See Row Information* High  MEWS guidelines implemented *See Row Information* Yes  Treat  MEWS Interventions Administered scheduled meds/treatments;Administered prn meds/treatments;Escalated (See documentation below)  Pain Scale 0-10  Pain Score 5  Pain Type Acute pain  Pain Location Jaw  Pain Orientation Right  Pain Descriptors / Indicators Aching  Pain Frequency Constant  Pain Onset On-going  Patients Stated Pain Goal 0  Pain Intervention(s) Cold applied;Other (Comment);Medication (See eMAR) (Tylenol given)  Multiple Pain Sites No  Take Vital Signs  Increase Vital Sign Frequency  Yellow: Q 2hr X 2 then Q 4hr X 2, if remains yellow, continue Q 4hrs  Escalate  MEWS: Escalate Yellow: discuss with charge nurse/RN and consider discussing with provider and RRT  Notify: Charge Nurse/RN  Name of Charge Nurse/RN Notified Carla, RN  Date Charge Nurse/RN Notified 06/20/21  Time Charge Nurse/RN Notified 0526  Notify: Provider  Provider Name/Title Littie Deeds  Date Provider Notified 06/20/21  Time Provider Notified [phone]  Notification Type Page  Notification Reason Change in status  Provider response See new orders  Date of Provider Response 06/20/21  Time of Provider Response 0500  Document  Progress note created (see row info) Yes

## 2021-06-20 NOTE — Evaluation (Signed)
Clinical/Bedside Swallow Evaluation Patient Details  Name: Dustin Powell MRN: [id number] Date of Birth: 09-30-1960  Today's Date: 06/20/2021 Time: SLP Start Time (ACUTE ONLY): 1356 SLP Stop Time (ACUTE ONLY): 1408 SLP Time Calculation (min) (ACUTE ONLY): 12 min  Past Medical History: History reviewed. No pertinent past medical history. Past Surgical History: History reviewed. No pertinent surgical history. HPI:  60 y.o. male presented to ED with pain and swelling in the right jaw, which began 2 months ago and worsened over the last week. The pt went to the ED on 9/23 for pain and swelling. He was diagnosed with a 1.9 cm submandibular stone with associated sialadenitis without abscess formation on CT scan. Now with acute infection. Hypopharynx and larynx unremarkable per CT. On-call ENT, Dr. [name], was called at that time and recommended antibiotics and pain control as well as outpatient follow-up for stone removal due to the nonemergent nature of this problem. Pt returned to ED 9/24 with worsening pain. MD note indicates some coughing and fever spike early morning of 9/25.    Assessment / Plan / Recommendation  Clinical Impression  Pt presents with odynophagia without dysphagia.  Submandibular swelling evident on right- however, it does not appear to interfere with airway protection. He demonstrates no s/s of aspiration.  Mastication/bolus control of soft solids and thin liquids is WFL. He requires several subswallows to transfer pudding through throat, but describes adequate passage.   Recommend resuming a regular diet with thin liquids so that pt has options for food selection (e.g, items like bananas that are softer but that would be excluded from a pureed diet).   No further acute care SLP f/u is needed. Our service will sign off.   SLP Visit Diagnosis: Dysphagia, unspecified (R13.10)    Aspiration Risk  No limitations    Diet Recommendation   Regular solids, thin liquids.    Medication Administration: Whole meds with liquid - crush if allows pt to swallow with greater ease.   Other  Recommendations Oral Care Recommendations: Oral care BID    Recommendations for follow up therapy are one component of a multi-disciplinary discharge planning process, led by the attending physician.  Recommendations may be updated based on patient status, additional functional criteria and insurance authorization.  Follow up Recommendations None        Swallow Study   General HPI: 60 y.o. male presented to ED with pain and swelling in the right jaw, which began 2 months ago and worsened over the last week. The pt went to the ED on 9/23 for pain and swelling. He was diagnosed with a 1.9 cm submandibular stone with associated sialadenitis without abscess formation on CT scan. Hypopharynx and larynx unremarkable per CT. On-call ENT, Dr. [name], was called at that time and recommended antibiotics and pain control as well as outpatient follow-up for stone removal due to the nonemergent nature of this problem. Pt returned to ED 9/24 with worsening pain. Type of Study: Bedside Swallow Evaluation Previous Swallow Assessment: no Diet Prior to this Study: NPO Temperature Spikes Noted: Yes Respiratory Status: Room air (CXR 9/25: Subtle small areas of airspace opacity in the left mid lung  consistent with pneumonia in the proper clinical setting. This  finding could be chronic, however.) History of Recent Intubation: No Behavior/Cognition: Alert;Cooperative;Pleasant mood Oral Cavity Assessment: Within Functional Limits Oral Care Completed by SLP: Recent completion by staff Oral Cavity - Dentition: Poor condition Vision: Functional for self-feeding Self-Feeding Abilities: Able to feed self Patient Positioning: Upright in bed  Baseline Vocal Quality: Normal Volitional Cough: Strong Volitional Swallow: Able to elicit    Oral/Motor/Sensory Function Overall Oral Motor/Sensory Function: Other  (comment) (edema right neck)   Ice Chips Ice chips: Within functional limits   Thin Liquid Thin Liquid: Within functional limits    Nectar Thick Nectar Thick Liquid: Not tested   Honey Thick Honey Thick Liquid: Not tested   Puree Puree: Within functional limits     Charo Philipp L. Samson Frederic, MA CCC/SLP Acute Rehabilitation Services Office number [phone] Pager [phone]           Blenda Mounts Laurice 06/20/2021,2:13 PM

## 2021-06-20 NOTE — Hospital Course (Addendum)
Dustin Powell is a 60 y.o. male with no significant PMHx who presented with jaw pain secondary to sialadenitis. Hospital course outlined by problem below:  Sialadenitis CT scan revealed 1.9 cm right submandibular stone.  He was admitted for pain control and IV antibiotics as he was unable to tolerate oral intake due to pain.  ENT said that they would see the patient outpatient and may remove the stone at that point.  Pain was controlled with IV hydromorphone and Tylenol.  He was put on IV ampicillin-sulbactam for antibiotics.  He became febrile during admission, warranting CXR, blood culture, and urine cultures revealed possible PNA.  Blood culture showed no growth.  Patient was afebrile for 48 hours prior to discharge.  Patient also clinically looked well.  Swelling and tenderness went down and patient was able to eat and drink well.  He had good pain control with scheduled Tylenol and was discharged on 10 days of Augmentin treatment in addition to having a follow-up for ENT that already scheduled prior to discharge.  Upper Respiratory Symptoms  Pt came in to the hospital with cough and nasal congestion onset for one day.  Possible patchy opacity on previous chest x-ray had relatively resolved on the second repeat x-ray, so broadened antibiotics did not feel necessary.  Also the patient clinically looked well with great improvement of cough and congestion prior to discharge.  Lab abnormalities  Pt also did have a drop in hemoglobin from 14>11.2 that was likely dilutional without any symptoms or active bleeding. However, we would advise a repeat CBC/BMP in a week and colonoscopy for screening given age.     Items for Follow Up: See ENT for possible stone removal  Repeat CBC at hospital follow up, consider ferritin and further iron studies as needed Ensure patient gets colonoscopy per screening recommendations.

## 2021-06-20 NOTE — Progress Notes (Addendum)
FPTS Interim Night Progress Note  S:Patient sleeping comfortably.  Rounded with primary night RN.  No concerns voiced.  No orders required.    O: Today's Vitals   06/19/21 2123 06/19/21 2324 06/19/21 2354 06/20/21 0440  BP: (!) 158/75   129/81  Pulse: 84   94  Resp: 18     Temp: 99.1 F (37.3 C)   (!) 102.9 F (39.4 C)  TempSrc: Oral   Oral  SpO2: 91%   (!) 89%  PainSc:  7  Asleep       A/P: Continue current management  Dana Allan MD PGY-3, [facility] pager [phone]

## 2021-06-20 NOTE — Progress Notes (Signed)
Spoke with Dr. [name], the on-call ENT physician.  Regarding his fever, he did not recommend any changes to his antibiotics.  He will ultimately need to have the submandibular gland removed which would ideally be done in a few weeks given infection.  He recommended that he be discharged on 10 days of Augmentin when stable and plan for ENT outpatient follow-up in the next few weeks.  If he is clinically worsening, repeat CT scan could be considered.

## 2021-06-21 ENCOUNTER — Inpatient Hospital Stay (HOSPITAL_COMMUNITY): Payer: 59

## 2021-06-21 DIAGNOSIS — M542 Cervicalgia: Secondary | ICD-10-CM

## 2021-06-21 DIAGNOSIS — R221 Localized swelling, mass and lump, neck: Secondary | ICD-10-CM | POA: Diagnosis not present

## 2021-06-21 DIAGNOSIS — K1120 Sialoadenitis, unspecified: Secondary | ICD-10-CM | POA: Diagnosis not present

## 2021-06-21 DIAGNOSIS — K115 Sialolithiasis: Secondary | ICD-10-CM | POA: Diagnosis not present

## 2021-06-21 LAB — BASIC METABOLIC PANEL
Anion gap: 8 (ref 5–15)
BUN: 10 mg/dL (ref 6–20)
CO2: 26 mmol/L (ref 22–32)
Calcium: 8.3 mg/dL — ABNORMAL LOW (ref 8.9–10.3)
Chloride: 107 mmol/L (ref 98–111)
Creatinine, Ser: 1.05 mg/dL (ref 0.61–1.24)
GFR, Estimated: >60 mL/min (ref >60)
Glucose, Bld: 83 mg/dL (ref 70–99)
Potassium: 3.7 mmol/L (ref 3.5–5.1)
Sodium: 141 mmol/L (ref 135–145)

## 2021-06-21 LAB — URINE CULTURE
Culture: NO GROWTH
Special Requests: NORMAL

## 2021-06-21 LAB — CBC
HCT: 34.3 % — ABNORMAL LOW (ref 39.0–52.0)
Hemoglobin: 11.9 g/dL — ABNORMAL LOW (ref 13.0–17.0)
MCH: 32.6 pg (ref 26.0–34.0)
MCHC: 34.7 g/dL (ref 30.0–36.0)
MCV: 94 fL (ref 80.0–100.0)
Platelets: 246 10*3/uL (ref 150–400)
RBC: 3.65 MIL/uL — ABNORMAL LOW (ref 4.22–5.81)
RDW: 11.9 % (ref 11.5–15.5)
WBC: 10.4 10*3/uL (ref 4.0–10.5)
nRBC: 0 % (ref 0.0–0.2)

## 2021-06-21 MED ORDER — IBUPROFEN 400 MG PO TABS: 400.0000 mg | ORAL_TABLET | Freq: Four times a day (QID) | ORAL | Status: DC | PRN

## 2021-06-21 NOTE — Progress Notes (Addendum)
FPTS Interim Progress Note  S:Patient with male partner. Reported no complaints, says he has been eating and drinking well without pain.   O: BP 133/89 (BP Location: Left Arm)   Pulse 75   Temp 98.8 F (37.1 C) (Oral)   Resp 18   Ht 5\' 5"  (1.651 m)   SpO2 98%   BMI 29.35 kg/m    Gen: NAD, in bed watching TV Resp: no iWOB, chest rises symmetrically  A/P: Sialadenitis -currently on Unasyn, day team to discuss transitioning to oral abx. Has been afebrile  -eating and drinking well on soft diet -tylenol and chloraseptic spray prn for any pain -f/u AM CBC, BMP added, orders reviewed  , MD 06/21/2021, 11:28 PM PGY-1, [facility] Service pager [phone]

## 2021-06-21 NOTE — Discharge Instructions (Addendum)
Thank you for letting us care for you during your stay.You were admitted to the [facility] Medicine Teaching Service. You were admitted for a submandibular stone. We found the following during your stay inflammation of the salivary gland. We gave you antibiotics and pain medications to help with the inflammation and infection.  We recommend follow up specifically with ENT, scheduled for appointment on October 3rd @2 :30PM.  Please follow up with your PCP on 10/4 at 10:30 am. Other important diagnoses identified during your stay were some cough that looked like possible pneumonia on x-ray. This cleared up and your cough improved as well. If your symptoms worsen or return, please return to the hospital. If you begin to have a fever, chills, extreme pain, inability to eat, chest pain, shortness of breath, please seek healthcare immediately. Please let 12/4 know if you have questions about your stay at [facility].   Thank you for letting [facility] be a part of you care, Family Medicine Teaching Service

## 2021-06-21 NOTE — Progress Notes (Signed)
FPTS Brief Progress Note  S: Sleeping.    O: BP 113/85 (BP Location: Left Arm)   Pulse 72   Temp 98.4 F (36.9 C) (Oral)   Resp 15   Ht 5\' 5"  (1.651 m)   SpO2 98%   BMI 29.35 kg/m   General: Asleep in hospital bed with male companion, no acute distress.   A/P: Sialadenitis - Orders reviewed. Labs for AM ordered, which was adjusted as needed.   , DO 06/21/2021, 3:09 AM PGY-3, [facility] Night Resident  Please page [phone] with questions.

## 2021-06-21 NOTE — Progress Notes (Addendum)
Family Medicine Teaching Service Daily Progress Note Intern Pager: [phone]  Patient name: Dustin Powell Medical record number: [id number] Date of birth: 1961-06-22 Age: 60 y.o. Gender: male  Primary Care Provider: Default, Provider, MD Consultants: ENT Code Status: Full  Pt Overview and Major Events to Date:  9/24: Admitted to FPTS  Assessment and Plan:  Dustin Powell is a 59 yo male presenting with 2 months of jaw pain found to have sialadenitis admitted for pain control and inability to tolerate oral intake.  No pertinent PMHx.   Sialadenitis  1.9 cm submandibular stone seen on CT scan.  Over the weekend Dr. [name] was called who was the on-call ENT physician.  There is no change to antibiotic course and he recommended to be discharged on 10 days of Augmentin when the patient was stable and plan for ENT outpatient in the next few weeks.  Ideally they will have removed the submandibular stone at that time.  Currently the patient is on Unasyn because he cannot tolerate p.o. Augmentin.  Last fever was 06/20/21 @1127  of 102.6 which warranted blood cultures, urine culture, and chest x-ray.  The patient is still not received a bedside swallow. Pt has not needed dilaudid since 9/24 -If he worsens clinically can consider repeat CT scan -Continue with Unasyn and transition to Augmentin when tolerated -Follow up blood culture - Scheduled Tylenol and can consider motrin   Upper Respiratory Symptoms  Initially the patient presented with cough and congestion onset 9/24.  The cough then became productive and patient had a fever that warranted a chest x-ray.  Chest x-ray showed left midlung opacity consistent with pneumonia.  Patient is satting 98% on room air.  As previously mentioned his last fever was on 9/25 at 1127.  - Will repeat chest x-ray before covering for atypicals with additional abx therapy  - Monitor O2 saturation - Follow-up blood culture  - Scheduled tylenol   Tachycardia,  resolved  Previously had HR to 120 yesterday morning. HR today 70-80s. Continue to monitor   Fever, resolved  Febrile to 102.6@1127  on 9/25. No fever since.  -Fever curve -Blood/Urine cx pending   Alcohol Use  12-18 beers every other weekend per patient. Last drink last weekend. No signs of withdrawal.  -CIWAs  FEN/GI: Soft diet, thin fluids  PPx: Lovenox  Dispo:Home pending clinical improvement . Barriers include clinical improvement.   Subjective:  Pt reports that he feels better today with a pain of 4/10 radiating to the right ear. Pt notes that he still has extreme difficulty eating, is only able to tolerate soft foods.   Objective: Temp:  [98.1 F (36.7 C)-98.4 F (36.9 C)] 98.4 F (36.9 C) (09/26 1249) Pulse Rate:  [59-72] 69 (09/26 1249) Resp:  [15-19] 19 (09/26 1249) BP: (113-131)/(85-88) 131/87 (09/26 1249) SpO2:  [96 %-99 %] 96 % (09/26 1249) Physical Exam Vitals reviewed.  Constitutional:      General: He is not in acute distress.    Appearance: Normal appearance.     Comments: Very pleasant patient resting in bed  HENT:     Nose: Congestion present.     Mouth/Throat:     Comments: Right jaw swelling and tenderness. No warmth, improved from my previous exam. Still has difficulty opening mouth Cardiovascular:     Rate and Rhythm: Normal rate and regular rhythm.  Pulmonary:     Effort: Pulmonary effort is normal. No respiratory distress.     Breath sounds: Normal breath sounds.  Abdominal:  Palpations: Abdomen is soft.     Tenderness: There is no abdominal tenderness.  Neurological:     Mental Status: He is alert.     Laboratory: Recent Labs  Lab 06/19/21 0826 06/20/21 0156 06/21/21 0110  WBC 14.2* 14.6* 10.4  HGB 14.1 12.8* 11.9*  HCT 41.2 38.4* 34.3*  PLT 260 234 246   Recent Labs  Lab 06/19/21 0826 06/20/21 0156 06/21/21 0110  NA 136 137 141  K 3.8 3.8 3.7  CL 102 103 107  CO2 22 24 26   BUN 11 12 10   CREATININE 1.01 1.13 1.05   CALCIUM 9.2 8.6* 8.3*  GLUCOSE 103* 111* 83     , MD 06/21/2021, 4:02 PM PGY-1, [facility] FPTS Intern pager: [phone], text pages welcome

## 2021-06-22 ENCOUNTER — Other Ambulatory Visit (HOSPITAL_COMMUNITY): Payer: Self-pay

## 2021-06-22 DIAGNOSIS — K1120 Sialoadenitis, unspecified: Secondary | ICD-10-CM | POA: Diagnosis not present

## 2021-06-22 DIAGNOSIS — K115 Sialolithiasis: Secondary | ICD-10-CM | POA: Diagnosis not present

## 2021-06-22 DIAGNOSIS — R221 Localized swelling, mass and lump, neck: Secondary | ICD-10-CM | POA: Diagnosis not present

## 2021-06-22 DIAGNOSIS — M542 Cervicalgia: Secondary | ICD-10-CM | POA: Diagnosis not present

## 2021-06-22 LAB — CBC
HCT: 33.9 % — ABNORMAL LOW (ref 39.0–52.0)
Hemoglobin: 11.2 g/dL — ABNORMAL LOW (ref 13.0–17.0)
MCH: 30.9 pg (ref 26.0–34.0)
MCHC: 33 g/dL (ref 30.0–36.0)
MCV: 93.4 fL (ref 80.0–100.0)
Platelets: 270 10*3/uL (ref 150–400)
RBC: 3.63 MIL/uL — ABNORMAL LOW (ref 4.22–5.81)
RDW: 11.8 % (ref 11.5–15.5)
WBC: 8 10*3/uL (ref 4.0–10.5)
nRBC: 0 % (ref 0.0–0.2)

## 2021-06-22 LAB — BASIC METABOLIC PANEL
Anion gap: 5 (ref 5–15)
BUN: 9 mg/dL (ref 6–20)
CO2: 26 mmol/L (ref 22–32)
Calcium: 8.4 mg/dL — ABNORMAL LOW (ref 8.9–10.3)
Chloride: 108 mmol/L (ref 98–111)
Creatinine, Ser: 0.98 mg/dL (ref 0.61–1.24)
GFR, Estimated: >60 mL/min (ref >60)
Glucose, Bld: 93 mg/dL (ref 70–99)
Potassium: 3.6 mmol/L (ref 3.5–5.1)
Sodium: 139 mmol/L (ref 135–145)

## 2021-06-22 MED ORDER — OXYCODONE HCL 5 MG PO TABS: 5.0000 mg | ORAL_TABLET | Freq: Three times a day (TID) | ORAL | Status: DC | PRN

## 2021-06-22 MED ORDER — AMOXICILLIN-POT CLAVULANATE 400-57 MG/5ML PO SUSR
800.0000 mg | Freq: Two times a day (BID) | ORAL | 0 refills | Status: AC
  Filled 2021-06-22: qty 150, 8d supply, fill #0

## 2021-06-22 MED ORDER — OXYCODONE HCL 5 MG PO TABS
5.0000 mg | ORAL_TABLET | Freq: Three times a day (TID) | ORAL | 0 refills | Status: AC | PRN
  Filled 2021-06-22: qty 15, 5d supply, fill #0

## 2021-06-22 MED ORDER — ACETAMINOPHEN 325 MG PO TABS: 650.0000 mg | ORAL_TABLET | Freq: Four times a day (QID) | ORAL | Status: DC | PRN

## 2021-06-22 MED ORDER — IBUPROFEN 400 MG PO TABS: 400.0000 mg | ORAL_TABLET | Freq: Four times a day (QID) | ORAL | 0 refills | Status: DC | PRN

## 2021-06-22 MED ORDER — AMOXICILLIN-POT CLAVULANATE 400-57 MG/5ML PO SUSR
800.0000 mg | Freq: Two times a day (BID) | ORAL | Status: DC
  Administered 2021-06-22: 800 mg via ORAL
  Filled 2021-06-22 (×2): qty 10

## 2021-06-22 MED ORDER — AMOXICILLIN-POT CLAVULANATE 400-57 MG/5ML PO SUSR
400.0000 mg | Freq: Two times a day (BID) | ORAL | 0 refills | Status: DC
  Filled 2021-06-22: qty 60, 6d supply, fill #0

## 2021-06-22 MED ORDER — PHENOL 1.4 % MT LIQD: 1.0000 | OROMUCOSAL | 0 refills | Status: AC | PRN

## 2021-06-22 NOTE — Discharge Summary (Addendum)
Family Medicine Teaching [facility] Discharge Summary  Patient name: Dustin Powell Medical record number: [id number] Date of birth: [birth year] Age: 60 y.o. Gender: male Date of Admission: 06/19/2021  Date of Discharge: 9/27 Admitting Physician: Reece Leader, DO  Primary Care Provider: Alfredo Martinez, MD Consultants: ENT  Indication for Hospitalization: right jaw pain   Discharge Diagnoses/Problem List:  Active Problems:   Neck mass   Salivary stone   Sialadenitis   Fever   Neck pain   Disposition: Home   Discharge Condition: Stable   Discharge Exam: Blood pressure (!) 145/88, pulse 69, temperature (!) 97.3 F (36.3 C), temperature source Oral, resp. rate 18, height 5\' 5"  (1.651 m), SpO2 98 %. Physical Exam Vitals reviewed.  Constitutional:      General: He is not in acute distress.    Appearance: Normal appearance. He is not toxic-appearing.  HENT:     Mouth/Throat:     Comments: Mild swelling of right jaw with minimal warmth. Minimal TTP radiating up to ear  Cardiovascular:     Rate and Rhythm: Normal rate and regular rhythm.  Pulmonary:     Effort: Pulmonary effort is normal. No respiratory distress.     Breath sounds: Normal breath sounds.  Abdominal:     General: Bowel sounds are normal. There is no distension.     Palpations: Abdomen is soft.     Tenderness: There is no abdominal tenderness.  Neurological:     Mental Status: He is alert.  Psychiatric:        Mood and Affect: Mood normal.        Behavior: Behavior normal.   Brief Hospital Course:  Dustin Powell is a 60 y.o. male with no significant PMHx who presented with jaw pain secondary to sialadenitis. Hospital course outlined by problem below:  Sialadenitis CT scan revealed 1.9 cm right submandibular stone.  He was admitted for pain control and IV antibiotics as he was unable to tolerate oral intake due to pain.  ENT said that they would see the patient outpatient and may remove the stone at that  point.  Pain was controlled with IV hydromorphone and Tylenol.  He was put on IV ampicillin-sulbactam for antibiotics.  He became febrile during admission, warranting CXR, blood culture, and urine cultures revealed possible PNA.  Blood culture showed no growth.  Patient was afebrile for 48 hours prior to discharge.  Patient also clinically looked well.  Swelling and tenderness went down and patient was able to eat and drink well.  He had good pain control with scheduled Tylenol and was discharged on 6 more days of Augmentin for a total of 10 day treatment in addition to having a follow-up for ENT that already scheduled prior to discharge.  Upper Respiratory Symptoms  Pt came in to the hospital with cough and nasal congestion onset for one day.  Possible patchy opacity on previous chest x-ray had relatively resolved on the second repeat x-ray, so broadened antibiotics did not feel necessary.  Also the patient clinically looked well with great improvement of cough and congestion prior to discharge.  Lab abnormalities  Pt also did have a drop in hemoglobin from 14>11.2 that was likely dilutional without any symptoms or active bleeding. However, we would advise a repeat CBC/BMP in a week and colonoscopy for screening given age.   All other issues chronic and stable.   Items for Follow Up: Please ensure that patient follows up with ENT outpatient, appointment scheduled for 10/3 prior to discharge.  Repeat CBC at hospital follow up, consider ferritin and further iron studies as needed Ensure patient gets colonoscopy per screening recommendations.  Ensure patient establishes with new PCP.     Significant Procedures: None   Significant Labs and Imaging:  Recent Labs  Lab 06/20/21 0156 06/21/21 0110 06/22/21 0323  WBC 14.6* 10.4 8.0  HGB 12.8* 11.9* 11.2*  HCT 38.4* 34.3* 33.9*  PLT 234 246 270   Recent Labs  Lab 06/18/21 0618 06/19/21 0826 06/20/21 0156 06/21/21 0110 06/22/21 0323  NA  138 136 137 141 139  K 3.6 3.8 3.8 3.7 3.6  CL 107 102 103 107 108  CO2 23 22 24 26 26   GLUCOSE 104* 103* 111* 83 93  BUN 9 11 12 10 9   CREATININE 1.04 1.01 1.13 1.05 0.98  CALCIUM 8.7* 9.2 8.6* 8.3* 8.4*    DG CHEST PORT 1 VIEW  Result Date: 06/21/2021 CLINICAL DATA:  Fever, neck pain EXAM: PORTABLE CHEST 1 VIEW COMPARISON:  06/20/2021 FINDINGS: Single frontal view of the chest demonstrates an unremarkable cardiac silhouette. The density seen at the left lung base on prior study have improved in the interim, with only minimal linear densities remaining. I would favor hypoventilatory changes or chronic scarring rather than acute airspace disease. No effusion or pneumothorax. No acute bony abnormalities. IMPRESSION: 1. Interval improvement in the left basilar density seen on prior study, favor residual atelectasis or chronic scarring. 2. No new process. Electronically Signed   By: 06/23/2021 M.D.   On: 06/21/2021 15:56    CT scan soft tissue of neck with contrast 06/18/21:   IMPRESSION: 1. Large right submandibular stone measuring up to 1.9 cm with multiple additional smaller stones. There is associated enlargement and heterogeneous enhancement of the gland with surrounding inflammatory change and free fluid consistent with sialadenitis. 2. No evidence of abscess formation in the neck. 3. Extensive dental disease as above. Recommend nonemergent dental consultation.  Blood culture no growth @ 1 day  Urine culture showed no growth   Results/Tests Pending at Time of Discharge: None   Discharge Medications:  Allergies as of 06/22/2021   No Known Allergies      Medication List     STOP taking these medications    amoxicillin-clavulanate 875-125 MG tablet Commonly known as: AUGMENTIN Replaced by: amoxicillin-clavulanate 400-57 MG/5ML suspension   HYDROcodone-acetaminophen 7.5-325 MG tablet Commonly known as: Norco       TAKE these medications    acetaminophen 325 MG  tablet Commonly known as: Tylenol Take 2 tablets (650 mg total) by mouth every 6 (six) hours as needed.   amoxicillin-clavulanate 400-57 MG/5ML suspension Commonly known as: AUGMENTIN Take 10 mLs (800 mg total) by mouth every 12 (twelve) hours for 6 days. Start taking on: June 23, 2021 Replaces: amoxicillin-clavulanate 875-125 MG tablet   FISH OIL PO Take 1 capsule by mouth daily.   ibuprofen 400 MG tablet Commonly known as: ADVIL Take 1 tablet (400 mg total) by mouth every 6 (six) hours as needed for moderate pain or mild pain.   oxyCODONE 5 MG immediate release tablet Commonly known as: Oxy IR/ROXICODONE Take 1 tablet (5 mg total) by mouth every 8 (eight) hours as needed for up to 5 days for moderate pain, severe pain or breakthrough pain.   phenol 1.4 % Liqd Commonly known as: CHLORASEPTIC Use as directed 1 spray in the mouth or throat as needed for up to 10 days for throat irritation / pain.  Discharge Instructions: Please refer to Patient Instructions section of EMR for full details.  Patient was counseled important signs and symptoms that should prompt return to medical care, changes in medications, dietary instructions, activity restrictions, and follow up appointments.   Follow-Up Appointments:  Follow-up Information     Christia Reading, MD. Go on 06/28/2021.   Specialty: Otolaryngology Why: Appointment at 2:30 pm, please arrive at least 15 minutes earlier than your scheduled appointment time. Please bring your with ID, insurance card and copay. Contact information: [address] [place] [address] [phone]         [facility] Follow up on 06/29/2021.   Why: Please arrive 15 minutes early for 10:30 AM appointment Contact information: [address] [place] [address] [phone]                Alfredo Martinez, MD 06/22/2021, 12:43 PM PGY-1, [facility]  I was  personally present and performed or re-performed the history, physical exam and medical decision making activities of this service and have verified that the service and findings are accurately documented in the resident's note. My edits are noted within the note. Please also see attending's attestation and notes.   Reece Leader, DO                  06/22/2021, 12:57 PM  PGY-2, [facility]

## 2021-06-25 LAB — CULTURE, BLOOD (ROUTINE X 2)
Culture: NO GROWTH
Culture: NO GROWTH
Special Requests: ADEQUATE
Special Requests: ADEQUATE

## 2021-06-29 ENCOUNTER — Other Ambulatory Visit: Payer: Self-pay

## 2021-06-29 ENCOUNTER — Ambulatory Visit: Payer: 59 | Admitting: Family Medicine

## 2021-06-29 VITALS — BP 127/86 | HR 66 | Ht 67.0 in | Wt 154.5 lb

## 2021-06-29 DIAGNOSIS — D649 Anemia, unspecified: Secondary | ICD-10-CM | POA: Diagnosis not present

## 2021-06-29 DIAGNOSIS — Z09 Encounter for follow-up examination after completed treatment for conditions other than malignant neoplasm: Secondary | ICD-10-CM

## 2021-06-29 DIAGNOSIS — R6884 Jaw pain: Secondary | ICD-10-CM | POA: Diagnosis not present

## 2021-06-29 DIAGNOSIS — Z1211 Encounter for screening for malignant neoplasm of colon: Secondary | ICD-10-CM

## 2021-06-29 MED ORDER — IBUPROFEN 400 MG PO TABS: 400.0000 mg | ORAL_TABLET | Freq: Four times a day (QID) | ORAL | 0 refills | Status: DC | PRN

## 2021-06-29 NOTE — Progress Notes (Signed)
   SUBJECTIVE:   CHIEF COMPLAINT / HPI:   Chief Complaint  Patient presents with   Follow-up    hospital     Dustin Powell is a 60 y.o. male here for hospital follow up for sialadenitis. Reports improvement in his right sided jaw pain. He completed his antibiotics and followed up with ENT yesterday. He is awaiting a call from Dr. [name] office to schedule his surgery.  No concerns today.   Pt would like to establish care at [facility].   PERTINENT  PMH / PSH: reviewed and updated as appropriate   OBJECTIVE:   BP 127/86   Pulse 66   Ht 5\' 7"  (1.702 m)   Wt 154 lb 8 oz (70.1 kg)   SpO2 100%   BMI 24.20 kg/m    GEN: well appearing male in no acute distress  CVS: well perfused  RESP: speaking in full sentences without pause, no respiratory distress  SKIN:      ASSESSMENT/PLAN:     Hospital Follow up - Microcytic Anemia: repeat CBC. Obtain iron studies. Referral for colonoscopy placed.  - Sialadenitis: followed up with Dr. yesterday. ENT to call him to schedule surgery    [name], DO PGY-3, [facility] 06/29/2021

## 2021-06-29 NOTE — Patient Instructions (Signed)
It was great seeing you today!   Visit Remembers: - Stop by the pharmacy to pick up your prescription(s) - Continue to work on your healthy eating habits and incorporating exercise into your daily life.  - Be sure to follow up with Dr. Lamount Cranker office to schedule your surgery    Regarding lab work today:  Due to recent changes in healthcare laws, you may see the results of your imaging and laboratory studies on MyChart before your provider has had a chance to review them.  I understand that in some cases there may be results that are confusing or concerning to you. Not all laboratory results come back in the same time frame and you may be waiting for multiple results in order to interpret others.  Please give [place] 72 hours in order for your provider to thoroughly review all the results before contacting the office for clarification of your results. If everything is normal, you will get a letter in the mail or a message in My Chart. Please give [place] a call if you do not hear from [place] after 2 weeks.     If you haven't already, sign up for My Chart to have easy access to your labs results, and communication with your primary care physician.   Take care,  [facility]

## 2021-06-30 LAB — CBC
Hematocrit: 41.5 % (ref 37.5–51.0)
Hemoglobin: 13.6 g/dL (ref 13.0–17.7)
MCH: 31 pg (ref 26.6–33.0)
MCHC: 32.8 g/dL (ref 31.5–35.7)
MCV: 95 fL (ref 79–97)
Platelets: 395 10*3/uL (ref 150–450)
RBC: 4.39 x10E6/uL (ref 4.14–5.80)
RDW: 12.1 % (ref 11.6–15.4)
WBC: 5.7 10*3/uL (ref 3.4–10.8)

## 2021-06-30 LAB — IRON AND TIBC
Iron Saturation: 24 % (ref 15–55)
Iron: 62 ug/dL (ref 38–169)
Total Iron Binding Capacity: 257 ug/dL (ref 250–450)
UIBC: 195 ug/dL (ref 111–343)

## 2021-06-30 LAB — FERRITIN: Ferritin: 1197 ng/mL — ABNORMAL HIGH (ref 30–400)

## 2021-07-01 ENCOUNTER — Encounter: Payer: Self-pay | Admitting: Internal Medicine

## 2021-07-27 HISTORY — PX: SALIVARY STONE REMOVAL: SHX5213

## 2021-08-25 ENCOUNTER — Encounter: Payer: Self-pay | Admitting: Internal Medicine

## 2021-08-25 ENCOUNTER — Ambulatory Visit (AMBULATORY_SURGERY_CENTER): Payer: 59 | Admitting: *Deleted

## 2021-08-25 ENCOUNTER — Other Ambulatory Visit: Payer: Self-pay

## 2021-08-25 VITALS — Ht 65.0 in | Wt 160.0 lb

## 2021-08-25 DIAGNOSIS — Z1211 Encounter for screening for malignant neoplasm of colon: Secondary | ICD-10-CM

## 2021-08-25 MED ORDER — PLENVU 140 G PO SOLR: 1.0000 | Freq: Once | ORAL | 0 refills | Status: AC

## 2021-08-25 NOTE — Progress Notes (Signed)
Virtual pre visit completed.  Instructions with coupon mailed to the patient.  No egg or soy allergy known to patient  No issues known to pt with past sedation with any surgeries or procedures Patient denies ever being told they had issues or difficulty with intubation  No FH of Malignant Hyperthermia Pt is not on diet pills Pt is not on  home 02  Pt is not on blood thinners  Pt denies issues with constipation  No A fib or A flutter   Coupon mailed , Code to Pharmacy and  NO PA's for preps discussed with pt In PV today  Discussed with pt there will be an out-of-pocket cost for prep and that varies from $0 to 70 +  dollars - pt verbalized understanding   Due to the COVID-19 pandemic we are asking patients to follow certain guidelines in PV and the [facility]   Pt aware of COVID protocols and [facility] guidelines

## 2021-09-03 ENCOUNTER — Telehealth: Payer: Self-pay | Admitting: Internal Medicine

## 2021-09-03 ENCOUNTER — Encounter: Payer: 59 | Admitting: Internal Medicine

## 2021-09-03 NOTE — Telephone Encounter (Signed)
Good Morning Dr. Leonides Schanz,   I called patient at 8:45am  and there was no answer and no voicemail to leave a message. I No Showed patient.

## 2021-10-11 ENCOUNTER — Ambulatory Visit: Payer: Self-pay | Admitting: Family

## 2021-10-25 ENCOUNTER — Ambulatory Visit: Payer: 59 | Admitting: Family

## 2021-10-25 ENCOUNTER — Encounter: Payer: Self-pay | Admitting: Family

## 2021-10-25 ENCOUNTER — Other Ambulatory Visit: Payer: Self-pay

## 2021-10-25 VITALS — BP 130/90 | HR 71 | Temp 97.0°F | Ht 65.0 in | Wt 159.0 lb

## 2021-10-25 DIAGNOSIS — Z23 Encounter for immunization: Secondary | ICD-10-CM

## 2021-10-25 DIAGNOSIS — R0981 Nasal congestion: Secondary | ICD-10-CM

## 2021-10-25 DIAGNOSIS — Z1211 Encounter for screening for malignant neoplasm of colon: Secondary | ICD-10-CM

## 2021-10-25 DIAGNOSIS — N529 Male erectile dysfunction, unspecified: Secondary | ICD-10-CM

## 2021-10-25 DIAGNOSIS — M7989 Other specified soft tissue disorders: Secondary | ICD-10-CM

## 2021-10-25 DIAGNOSIS — Z7689 Persons encountering health services in other specified circumstances: Secondary | ICD-10-CM | POA: Diagnosis not present

## 2021-10-25 DIAGNOSIS — B009 Herpesviral infection, unspecified: Secondary | ICD-10-CM

## 2021-10-25 MED ORDER — LORATADINE 10 MG PO TABS: 10.0000 mg | ORAL_TABLET | Freq: Every day | ORAL | 11 refills | Status: AC

## 2021-10-25 MED ORDER — FLUTICASONE PROPIONATE 50 MCG/ACT NA SUSP: 2.0000 | Freq: Every day | NASAL | 6 refills | Status: AC

## 2021-10-25 MED ORDER — TETANUS-DIPHTH-ACELL PERTUSSIS 5-2.5-18.5 LF-MCG/0.5 IM SUSP: 0.5000 mL | Freq: Once | INTRAMUSCULAR | 0 refills | Status: AC

## 2021-10-25 MED ORDER — TADALAFIL 20 MG PO TABS: 20.0000 mg | ORAL_TABLET | Freq: Every day | ORAL | 1 refills | Status: DC | PRN

## 2021-10-25 MED ORDER — ACYCLOVIR 400 MG PO TABS: 400.0000 mg | ORAL_TABLET | Freq: Two times a day (BID) | ORAL | 1 refills | Status: DC

## 2021-10-25 NOTE — Progress Notes (Signed)
Provider: Marlowe Sax FNP-C   Beryle Zeitz, Nelda Bucks, NP  Patient Care Team: Eben Choinski, Nelda Bucks, NP as PCP - General (Family Medicine)  Extended Emergency Contact Information Primary Emergency Contact: baldwin,sherry Mobile Phone: [phone] Relation: Significant other  Code Status:  Full Code  Goals of care: Advanced Directive information Advanced Directives 10/25/2021  Does Patient Have a Medical Advance Directive? No  Would patient like information on creating a medical advance directive? -     Chief Complaint  Patient presents with   Establish Care    New patient to establish care.    HPI:  Pt is a 61 y.o. male seen today for medical management of chronic diseases.Has medical history of Erectile dysfunction,genital Herpes,sialadenitis in 2022 which was operated no no more issues. Takes Acyclovir for genital Herpes. States currently no rash.Acyclovir has been effective in preventing recurrence.  Tadalafil for  ED.Has seen Urologist in the past.no records.    Has had nasal congestion and drainage x 6 months.congestion worst whenever he lie on the side Also states his major concerns is swelling on left middle and 5 th finger.sometimes has throbbing pain but no redness and does not feel stiff.swelling has been on and off for several days.thought possible from weight lifting.No fever or chills.No other joint swelling.   Drinks alcohol occasional on Holidays 3-4 beers and one glass of wines. None smoker.  Exercise by weight lifting one hour daily x 7 days.Also does push ups stretching and sit ups.Also swims once a months. Would like to swim twice a month.    Past Medical History:  Diagnosis Date   Erectile dysfunction    Herpes genitalia    Past Surgical History:  Procedure Laterality Date   SALIVARY STONE REMOVAL  07/2021    No Known Allergies  Allergies as of 10/25/2021   No Known Allergies      Medication List        Accurate as of October 25, 2021  8:29  PM. If you have any questions, ask your nurse or doctor.          STOP taking these medications    acetaminophen 325 MG tablet Commonly known as: Tylenol Stopped by: Sandrea Hughs, NP   FISH OIL PO Stopped by: Nelda Bucks Won Kreuzer, NP   ibuprofen 400 MG tablet Commonly known as: ADVIL Stopped by: Sandrea Hughs, NP       TAKE these medications    acyclovir 400 MG tablet Commonly known as: ZOVIRAX Take 1 tablet (400 mg total) by mouth 2 (two) times daily.   fluticasone 50 MCG/ACT nasal spray Commonly known as: FLONASE Place 2 sprays into both nostrils daily. Started by: Sandrea Hughs, NP   loratadine 10 MG tablet Commonly known as: CLARITIN Take 1 tablet (10 mg total) by mouth daily. Started by: Sandrea Hughs, NP   tadalafil 20 MG tablet Commonly known as: CIALIS Take 1 tablet (20 mg total) by mouth daily as needed for erectile dysfunction.   Tdap 5-2.5-18.5 LF-MCG/0.5 injection Commonly known as: BOOSTRIX Inject 0.5 mLs into the muscle once for 1 dose.        Review of Systems  Constitutional:  Negative for appetite change, chills, fatigue, fever and unexpected weight change.  HENT:  Negative for congestion, dental problem, ear discharge, ear pain, facial swelling, hearing loss, nosebleeds, postnasal drip, rhinorrhea, sinus pressure, sinus pain, sneezing, sore throat, tinnitus and trouble swallowing.   Eyes:  Positive for visual disturbance. Negative for pain, discharge,  redness and itching.       Wears eye glasses follows up with [facility]   Respiratory:  Negative for cough, chest tightness, shortness of breath and wheezing.   Cardiovascular:  Negative for chest pain, palpitations and leg swelling.  Gastrointestinal:  Negative for abdominal distention, abdominal pain, blood in stool, constipation, diarrhea, nausea and vomiting.  Endocrine: Negative for cold intolerance, heat intolerance, polydipsia, polyphagia and polyuria.  Genitourinary:   Negative for difficulty urinating, dysuria, flank pain, frequency and urgency.       ED  Musculoskeletal:  Positive for arthralgias. Negative for back pain, gait problem, joint swelling, myalgias, neck pain and neck stiffness.  Skin:  Negative for color change, pallor, rash and wound.  Neurological:  Negative for dizziness, syncope, speech difficulty, weakness, light-headedness, numbness and headaches.  Hematological:  Does not bruise/bleed easily.  Psychiatric/Behavioral:  Negative for agitation, behavioral problems, confusion, hallucinations, self-injury, sleep disturbance and suicidal ideas. The patient is not nervous/anxious.        Sleeps 6-7 hrs     There is no immunization history on file for this patient. Pertinent  Health Maintenance Due  Topic Date Due   COLONOSCOPY (Pts 45-57yrs Insurance coverage will need to be confirmed)  Never done   INFLUENZA VACCINE  12/24/2021 (Originally 04/26/2021)   Fall Risk 06/20/2021 06/21/2021 06/21/2021 06/22/2021 10/25/2021  Falls in the past year? - - - - 0  Was there an injury with Fall? - - - - 0  Fall Risk Category Calculator - - - - 0  Fall Risk Category - - - - Low  Patient Fall Risk Level Moderate fall risk Low fall risk Low fall risk Low fall risk Low fall risk  Patient at Risk for Falls Due to - - - - No Fall Risks  Fall risk Follow up - - - - Falls evaluation completed   Functional Status Survey:    Vitals:   10/25/21 1023 10/25/21 1030  BP: (!) 130/100 130/90  Pulse: 71   Temp: (!) 97 F (36.1 C)   SpO2: 97%   Weight: 159 lb (72.1 kg)   Height: 5\' 5"  (1.651 m)    Body mass index is 26.46 kg/m. Physical Exam Vitals reviewed.  Constitutional:      General: He is not in acute distress.    Appearance: Normal appearance. He is normal weight. He is not ill-appearing or diaphoretic.  HENT:     Head: Normocephalic.     Right Ear: Tympanic membrane, ear canal and external ear normal. There is no impacted cerumen.     Left Ear:  Tympanic membrane, ear canal and external ear normal. There is no impacted cerumen.     Nose: Nose normal. No congestion or rhinorrhea.     Right Turbinates: Enlarged and swollen.     Left Turbinates: Enlarged and swollen.     Mouth/Throat:     Mouth: Mucous membranes are moist.     Pharynx: Oropharynx is clear. No oropharyngeal exudate or posterior oropharyngeal erythema.  Eyes:     General: No scleral icterus.       Right eye: No discharge.        Left eye: No discharge.     Extraocular Movements: Extraocular movements intact.     Conjunctiva/sclera: Conjunctivae normal.     Pupils: Pupils are equal, round, and reactive to light.  Neck:     Vascular: No carotid bruit.  Cardiovascular:     Rate and Rhythm: Normal rate and  regular rhythm.     Pulses: Normal pulses.     Heart sounds: Normal heart sounds. No murmur heard.   No friction rub. No gallop.  Pulmonary:     Effort: Pulmonary effort is normal. No respiratory distress.     Breath sounds: Normal breath sounds. No wheezing, rhonchi or rales.  Chest:     Chest wall: No tenderness.  Abdominal:     General: Bowel sounds are normal. There is no distension.     Palpations: Abdomen is soft. There is no mass.     Tenderness: There is no abdominal tenderness. There is no right CVA tenderness, left CVA tenderness, guarding or rebound.  Musculoskeletal:        General: No swelling or tenderness. Normal range of motion.     Cervical back: Normal range of motion. No rigidity or tenderness.     Right lower leg: No edema.     Left lower leg: No edema.     Comments: Left middle and right 5th finger swollen,sore without any erythema.  Lymphadenopathy:     Cervical: No cervical adenopathy.  Skin:    General: Skin is warm and dry.     Coloration: Skin is not pale.     Findings: No bruising, erythema, lesion or rash.  Neurological:     Mental Status: He is alert and oriented to person, place, and time.     Cranial Nerves: No cranial  nerve deficit.     Sensory: No sensory deficit.     Motor: No weakness.     Coordination: Coordination normal.     Gait: Gait normal.  Psychiatric:        Mood and Affect: Mood normal.        Speech: Speech normal.        Behavior: Behavior normal.        Thought Content: Thought content normal.        Judgment: Judgment normal.    Labs reviewed: Recent Labs    06/20/21 0156 06/21/21 0110 06/22/21 0323  NA 137 141 139  K 3.8 3.7 3.6  CL 103 107 108  CO2 24 26 26   GLUCOSE 111* 83 93  BUN 12 10 9   CREATININE 1.13 1.05 0.98  CALCIUM 8.6* 8.3* 8.4*   No results for input(s): AST, ALT, ALKPHOS, BILITOT, PROT, ALBUMIN in the last 8760 hours. Recent Labs    06/18/21 0618 06/19/21 0826 06/20/21 0156 06/21/21 0110 06/22/21 0323 06/29/21 1122  WBC 8.6 14.2*   < > 10.4 8.0 5.7  NEUTROABS 5.4 11.6*  --   --   --   --   HGB 12.8* 14.1   < > 11.9* 11.2* 13.6  HCT 39.0 41.2   < > 34.3* 33.9* 41.5  MCV 95.4 93.0   < > 94.0 93.4 95  PLT 233 260   < > 246 270 395   < > = values in this interval not displayed.   No results found for: TSH No results found for: HGBA1C No results found for: CHOL, HDL, LDLCALC, LDLDIRECT, TRIG, CHOLHDL  Significant Diagnostic Results in last 30 days:  No results found.  Assessment/Plan  1. Encounter to establish care Available medical records on Epic reviewed.Recommended Annual Physical examination and fasting labs.Immunization reviewed but declines Influenza vaccine and COVID-19 vaccine. Advised to get his shingles vaccine at the pharmacy.  2. Need for Tdap vaccination Advised to get Tdap vaccine at the pharmacy. Script send to pharmacy today - Tdap ([place])  5-2.5-18.5 LF-MCG/0.5 injection; Inject 0.5 mLs into the muscle once for 1 dose.  Dispense: 0.5 mL; Refill: 0  3. Colon cancer screening Asymptomatic  - Ambulatory referral to Gastroenterology  4. Herpes simplex Reports no rash today.acyclovir has been effective  - acyclovir  (ZOVIRAX) 400 MG tablet; Take 1 tablet (400 mg total) by mouth 2 (two) times daily.  Dispense: 180 tablet; Refill: 1  5. Erectile dysfunction, unspecified erectile dysfunction type Seen by urologist in the past no records for review.continue on tadalafil  - tadalafil (CIALIS) 20 MG tablet; Take 1 tablet (20 mg total) by mouth daily as needed for erectile dysfunction.  Dispense: 30 tablet; Refill: 1  6. Nasal congestion Bilateral turbinates enlarged and swollen. - loratadine (CLARITIN) 10 MG tablet; Take 1 tablet (10 mg total) by mouth daily.  Dispense: 30 tablet; Refill: 11 - fluticasone (FLONASE) 50 MCG/ACT nasal spray; Place 2 sprays into both nostrils daily.  Dispense: 16 g; Refill: 6  7. Swollen finger Left middle and right 5 th fingers swollen and sore without any erythema. Will obtain imaging suspect possible from weight lifting verse osteoarthritis.  - DG Hand Complete Right; Future - DG Hand Complete Left; Future  Family/ staff Communication: Reviewed plan of care with patient verbalized understanding.  Labs/tests ordered:  - DG Hand Complete Right; Future - DG Hand Complete Left; Future  Next Appointment : 1 month for annual Physical examination with fasting labs.   Sandrea Hughs, NP

## 2021-10-25 NOTE — Patient Instructions (Signed)
Please get bilateral hand X-ray at [facility] imaging at [facility] then will call you with results.

## 2022-01-26 ENCOUNTER — Encounter: Payer: Self-pay | Admitting: Internal Medicine

## 2022-02-17 ENCOUNTER — Other Ambulatory Visit: Payer: Self-pay | Admitting: Family

## 2022-02-17 ENCOUNTER — Telehealth: Payer: Self-pay | Admitting: *Deleted

## 2022-02-17 DIAGNOSIS — N529 Male erectile dysfunction, unspecified: Secondary | ICD-10-CM

## 2022-02-17 NOTE — Telephone Encounter (Signed)
Called pt multiple times for 0900 PV appt. No answer and no voicemail so unable to leave message. If pt does not call to reschedule PV by 5pm, colon will be cancelled.

## 2022-02-17 NOTE — Telephone Encounter (Signed)
Patient returned call and was scheduled for tomorrow at 1:30

## 2022-02-18 ENCOUNTER — Ambulatory Visit (AMBULATORY_SURGERY_CENTER): Payer: 59

## 2022-02-18 ENCOUNTER — Other Ambulatory Visit: Payer: Self-pay

## 2022-02-18 VITALS — Ht 64.0 in | Wt 165.0 lb

## 2022-02-18 DIAGNOSIS — Z1211 Encounter for screening for malignant neoplasm of colon: Secondary | ICD-10-CM

## 2022-02-18 MED ORDER — NA SULFATE-K SULFATE-MG SULF 17.5-3.13-1.6 GM/177ML PO SOLN
1.0000 | Freq: Once | ORAL | 0 refills | Status: AC
Start: 2022-02-18 — End: 2022-02-18

## 2022-02-18 NOTE — Progress Notes (Signed)
Denies allergies to eggs or soy products. Denies complication of anesthesia or sedation. Denies use of weight loss medication. Denies use of O2.   Emmi instructions given for colonoscopy.  

## 2022-03-09 ENCOUNTER — Encounter: Payer: Self-pay | Admitting: Internal Medicine

## 2022-03-10 ENCOUNTER — Telehealth: Payer: Self-pay | Admitting: Internal Medicine

## 2022-03-10 ENCOUNTER — Encounter: Payer: 59 | Admitting: Internal Medicine

## 2022-03-10 NOTE — Telephone Encounter (Signed)
Called pt-- pt stated he landed a new job and cannot get off work-- He will call us back to R/S.

## 2022-05-11 ENCOUNTER — Other Ambulatory Visit: Payer: Self-pay | Admitting: Family

## 2022-05-11 DIAGNOSIS — N529 Male erectile dysfunction, unspecified: Secondary | ICD-10-CM

## 2022-05-12 NOTE — Telephone Encounter (Signed)
Pharmacy requested refill.  Patient needs an appointment before anymore Future Refills.   Pended Rx and sent to [facility] for approval.

## 2022-06-09 ENCOUNTER — Encounter: Payer: Self-pay | Admitting: Family

## 2022-06-09 NOTE — Progress Notes (Signed)
  This encounter was created in error - please disregard. No show 

## 2022-08-16 ENCOUNTER — Other Ambulatory Visit: Payer: Self-pay | Admitting: Family

## 2022-08-16 DIAGNOSIS — N529 Male erectile dysfunction, unspecified: Secondary | ICD-10-CM

## 2022-08-31 ENCOUNTER — Other Ambulatory Visit: Payer: Self-pay | Admitting: Family

## 2022-08-31 DIAGNOSIS — N529 Male erectile dysfunction, unspecified: Secondary | ICD-10-CM

## 2022-09-02 ENCOUNTER — Other Ambulatory Visit: Payer: Self-pay | Admitting: Family

## 2022-09-02 DIAGNOSIS — N529 Male erectile dysfunction, unspecified: Secondary | ICD-10-CM

## 2022-10-07 ENCOUNTER — Ambulatory Visit (INDEPENDENT_AMBULATORY_CARE_PROVIDER_SITE_OTHER): Payer: Managed Care, Other (non HMO) | Admitting: Family

## 2022-10-07 ENCOUNTER — Encounter: Payer: Self-pay | Admitting: Family

## 2022-10-07 VITALS — BP 140/100 | HR 70 | Temp 97.9°F | Resp 16 | Ht 64.0 in | Wt 158.2 lb

## 2022-10-07 DIAGNOSIS — Z125 Encounter for screening for malignant neoplasm of prostate: Secondary | ICD-10-CM

## 2022-10-07 DIAGNOSIS — Z0000 Encounter for general adult medical examination without abnormal findings: Secondary | ICD-10-CM | POA: Diagnosis not present

## 2022-10-07 DIAGNOSIS — T753XXA Motion sickness, initial encounter: Secondary | ICD-10-CM | POA: Diagnosis not present

## 2022-10-07 DIAGNOSIS — B009 Herpesviral infection, unspecified: Secondary | ICD-10-CM

## 2022-10-07 DIAGNOSIS — N529 Male erectile dysfunction, unspecified: Secondary | ICD-10-CM

## 2022-10-07 DIAGNOSIS — Z1159 Encounter for screening for other viral diseases: Secondary | ICD-10-CM

## 2022-10-07 DIAGNOSIS — I10 Essential (primary) hypertension: Secondary | ICD-10-CM

## 2022-10-07 DIAGNOSIS — Z13220 Encounter for screening for lipoid disorders: Secondary | ICD-10-CM

## 2022-10-07 MED ORDER — LOSARTAN POTASSIUM 25 MG PO TABS: 25.0000 mg | ORAL_TABLET | Freq: Every day | ORAL | 1 refills | Status: DC

## 2022-10-07 MED ORDER — TADALAFIL 20 MG PO TABS: ORAL_TABLET | ORAL | 3 refills | Status: DC

## 2022-10-07 MED ORDER — MECLIZINE HCL 25 MG PO TABS: 25.0000 mg | ORAL_TABLET | Freq: Three times a day (TID) | ORAL | 0 refills | Status: DC | PRN

## 2022-10-07 MED ORDER — ACYCLOVIR 400 MG PO TABS: 400.0000 mg | ORAL_TABLET | Freq: Two times a day (BID) | ORAL | 1 refills | Status: DC

## 2022-10-07 NOTE — Progress Notes (Signed)
Provider: Richarda Blade FNP-C   Shandell Giovanni, Donalee Citrin, NP  Patient Care Team: Kimbella Heisler, Donalee Citrin, NP as PCP - General (Family Medicine)  Extended Emergency Contact Information Primary Emergency Contact: baldwin,sherry Mobile Phone: [phone] Relation: Significant other  Code Status:  Full Code  Goals of care: Advanced Directive information    10/07/2022    3:36 PM  Advanced Directives  Does Patient Have a Medical Advance Directive? No  Would patient like information on creating a medical advance directive? No - Patient declined     Chief Complaint  Patient presents with   Annual Exam    PHYSICAL   Health Maintenance    Discuss the need for Hepatitis C Screening, and Colonoscopy.   Immunizations    Discuss the need for Covid Booster, Dtap vaccine, Shingrix vaccine, and Influenza vaccine.    HPI:  Pt is a 62 y.o. male seen today for annual physical exam and medical management of chronic diseases.    States did not get colonoscopy done since he had started a new Job and was not given time off but now he can schedule.  He states will be going on a cruise to [place] soon request medication for motion sickness.   Drinks beer occasionally during the weekends.   He was advised on previous visit to get Tdap and shingles vaccine at the pharmacy but has not gotten them. Also due for COVID-19 and influenza vaccine but declines influenza vaccine this visit states took it in the past and it made him sick.   B/p elevated this visit rechecked still high.states has a B/p cuff somewhere at home but does not check B/p.denies any headache,dizziness,vision changes,fatigue,chest tightness,palpitation,chest pain or shortness of breath.   States does exercise by doing push ups ,Jumping Jacks and sit ups 25 -30 per day. States would like his cholesterol and Prostate labs checked.   Past Medical History:  Diagnosis Date   Erectile dysfunction    Herpes genitalia    Past Surgical History:   Procedure Laterality Date   SALIVARY STONE REMOVAL  07/2021    No Known Allergies  Allergies as of 10/07/2022   No Known Allergies      Medication List        Accurate as of October 07, 2022  5:00 PM. If you have any questions, ask your nurse or doctor.          acyclovir 400 MG tablet Commonly known as: ZOVIRAX Take 1 tablet (400 mg total) by mouth 2 (two) times daily.   fluticasone 50 MCG/ACT nasal spray Commonly known as: FLONASE Place 2 sprays into both nostrils daily.   loratadine 10 MG tablet Commonly known as: CLARITIN Take 1 tablet (10 mg total) by mouth daily.   losartan 25 MG tablet Commonly known as: COZAAR Take 1 tablet (25 mg total) by mouth daily. Started by: Caesar Bookman, NP   meclizine 25 MG tablet Commonly known as: ANTIVERT Take 1 tablet (25 mg total) by mouth 3 (three) times daily as needed for dizziness. Started by: Caesar Bookman, NP   tadalafil 20 MG tablet Commonly known as: CIALIS TAKE 1 TABLET BY MOUTH ONCE DAILY AS NEEDED FOR ERECTILE DYSFUNCTION . APPOINTMENT REQUIRED FOR FUTURE REFILLS        Review of Systems  Constitutional:  Negative for appetite change, chills, fatigue, fever and unexpected weight change.  HENT:  Negative for congestion, dental problem, ear discharge, ear pain, facial swelling, hearing loss, nosebleeds, postnasal drip, rhinorrhea, sinus  pressure, sinus pain, sneezing, sore throat, tinnitus and trouble swallowing.   Eyes:  Negative for pain, discharge, redness, itching and visual disturbance.  Respiratory:  Negative for cough, chest tightness, shortness of breath and wheezing.   Cardiovascular:  Negative for chest pain, palpitations and leg swelling.  Gastrointestinal:  Negative for abdominal distention, abdominal pain, blood in stool, constipation, diarrhea, nausea and vomiting.  Endocrine: Negative for cold intolerance, heat intolerance, polydipsia, polyphagia and polyuria.  Genitourinary:  Negative for  difficulty urinating, dysuria, flank pain, frequency and urgency.  Musculoskeletal:  Negative for arthralgias, back pain, gait problem, joint swelling, myalgias, neck pain and neck stiffness.  Skin:  Negative for color change, pallor, rash and wound.  Neurological:  Negative for dizziness, syncope, speech difficulty, weakness, light-headedness, numbness and headaches.  Hematological:  Does not bruise/bleed easily.  Psychiatric/Behavioral:  Negative for agitation, behavioral problems, confusion, hallucinations, self-injury, sleep disturbance and suicidal ideas. The patient is not nervous/anxious.      There is no immunization history on file for this patient. Pertinent  Health Maintenance Due  Topic Date Due   COLONOSCOPY (Pts 45-37yrs Insurance coverage will need to be confirmed)  Never done   INFLUENZA VACCINE  Discontinued      06/21/2021   10:52 AM 06/21/2021    8:00 PM 06/22/2021    7:45 AM 10/25/2021   10:23 AM 10/07/2022    3:36 PM  Fall Risk  Falls in the past year?    0 0  Was there an injury with Fall?    0 0  Fall Risk Category Calculator    0 0  Fall Risk Category    Low Low  Patient Fall Risk Level Low fall risk Low fall risk Low fall risk Low fall risk Low fall risk  Patient at Risk for Falls Due to    No Fall Risks No Fall Risks  Fall risk Follow up    Falls evaluation completed Falls evaluation completed   Functional Status Survey:    Vitals:   10/07/22 1529 10/07/22 1642  BP: (!) 150/100 (!) 140/100  Pulse: 70   Resp: 16   Temp: 97.9 F (36.6 C)   SpO2: 97%   Weight: 158 lb 3.2 oz (71.8 kg)   Height: 5\' 4"  (1.626 m)    Body mass index is 27.15 kg/m. Physical Exam Vitals reviewed.  Constitutional:      General: He is not in acute distress.    Appearance: Normal appearance. He is overweight. He is not ill-appearing or diaphoretic.  HENT:     Head: Normocephalic.     Right Ear: Tympanic membrane, ear canal and external ear normal. There is no impacted  cerumen.     Left Ear: Tympanic membrane, ear canal and external ear normal. There is no impacted cerumen.     Nose: Nose normal. No congestion or rhinorrhea.     Mouth/Throat:     Mouth: Mucous membranes are moist.     Pharynx: Oropharynx is clear. No oropharyngeal exudate or posterior oropharyngeal erythema.  Eyes:     General: No scleral icterus.       Right eye: No discharge.        Left eye: No discharge.     Extraocular Movements: Extraocular movements intact.     Conjunctiva/sclera: Conjunctivae normal.     Pupils: Pupils are equal, round, and reactive to light.  Neck:     Vascular: No carotid bruit.  Cardiovascular:     Rate and Rhythm: Normal  rate and regular rhythm.     Pulses: Normal pulses.     Heart sounds: Normal heart sounds. No murmur heard.    No friction rub. No gallop.  Pulmonary:     Effort: Pulmonary effort is normal. No respiratory distress.     Breath sounds: Normal breath sounds. No wheezing, rhonchi or rales.  Chest:     Chest wall: No tenderness.  Abdominal:     General: Bowel sounds are normal. There is no distension.     Palpations: Abdomen is soft. There is no mass.     Tenderness: There is no abdominal tenderness. There is no right CVA tenderness, left CVA tenderness, guarding or rebound.  Musculoskeletal:        General: No swelling or tenderness. Normal range of motion.     Cervical back: Normal range of motion. No rigidity or tenderness.     Right lower leg: No edema.     Left lower leg: No edema.  Lymphadenopathy:     Cervical: No cervical adenopathy.  Skin:    General: Skin is warm and dry.     Coloration: Skin is not pale.     Findings: No bruising, erythema, lesion or rash.  Neurological:     Mental Status: He is alert and oriented to person, place, and time.     Cranial Nerves: No cranial nerve deficit.     Sensory: No sensory deficit.     Motor: No weakness.     Coordination: Coordination normal.     Gait: Gait normal.   Psychiatric:        Mood and Affect: Mood normal.        Speech: Speech normal.        Behavior: Behavior normal.        Thought Content: Thought content normal.        Judgment: Judgment normal.     Labs reviewed: No results for input(s): "NA", "K", "CL", "CO2", "GLUCOSE", "BUN", "CREATININE", "CALCIUM", "MG", "PHOS" in the last 8760 hours. No results for input(s): "AST", "ALT", "ALKPHOS", "BILITOT", "PROT", "ALBUMIN" in the last 8760 hours. No results for input(s): "WBC", "NEUTROABS", "HGB", "HCT", "MCV", "PLT" in the last 8760 hours. No results found for: "TSH" No results found for: "HGBA1C" No results found for: "CHOL", "HDL", "LDLCALC", "LDLDIRECT", "TRIG", "CHOLHDL"  Significant Diagnostic Results in last 30 days:  No results found.  Assessment/Plan 1. Encounter for preventive care  Immunization COVID 19 booster vaccine, DTaP, Shingrix and influenza vaccines due. Medication and labs reviewed patient counselled regarding yearly exam, prevention of dental and periodontal disease, diet, regular sustained exercise for at least 30 minutes x 3 /week. recommended schedule for routine labs. Fall screening,  2. Motion sickness, initial encounter going on a cruise to [place] soon request medication for motion sickness.  Start on meclizine.  Side effects discussed - meclizine (ANTIVERT) 25 MG tablet; Take 1 tablet (25 mg total) by mouth 3 (three) times daily as needed for dizziness.  Dispense: 30 tablet; Refill: 0 - COMPLETE METABOLIC PANEL WITH GFR; Future - CBC with Differential/Platelet; Future  3. Herpes simplex ASymptomatic - acyclovir (ZOVIRAX) 400 MG tablet; Take 1 tablet (400 mg total) by mouth 2 (two) times daily.  Dispense: 180 tablet; Refill: 1 - COMPLETE METABOLIC PANEL WITH GFR; Future - CBC with Differential/Platelet; Future  4. Erectile dysfunction, unspecified erectile dysfunction type Continue on Cialis  5. Encounter for hepatitis C screening test for low risk  patient Low risk - Hep C Antibody;  Future  6. Screening for hyperlipidemia Dietary modification and exercise advised will like cholesterol checked. - Lipid panel; Future  7. Prostate cancer screening Requests prostate cancer screening - PSA, Total and Free; Future  8. Primary hypertension Blood pressure is elevated during visits recheck still high Will start on losartan 25 mg tablet 1 by mouth daily Advised to check blood pressure at home and bring log to visit to reevaluate.  Family/ staff Communication: Reviewed plan of care with patient verbalized understanding  Labs/tests ordered:  - CBC with Differential/Platelet - CMP with eGFR([facility]) - TSH - Lipid panel - Hep C Antibody - PSA, Total and Free; Future  Next Appointment : Return for annual Physical examination, fasting labs in one week or sooner  and B/p .   Caesar Bookman, NP

## 2022-10-07 NOTE — Patient Instructions (Addendum)
Please contact your local pharmacy, previous provider, or insurance carrier for vaccine/immunization records. Ensure that any procedures done outside of [facility] are faxed to [place] [phone] or you can sign release of records form at the front desk to keep your medical record updated.    - Please get Tetanus vaccine,shingles and COVID-19 vaccine at the pharmacy.  Referral to [facility] Gastroenterologist office telephone #  [phone] and schedule appointment for colonoscopy.   -  check Blood pressure at home and record on log provided and notify provider if B/p > 140/90

## 2022-10-12 ENCOUNTER — Ambulatory Visit (INDEPENDENT_AMBULATORY_CARE_PROVIDER_SITE_OTHER): Payer: Managed Care, Other (non HMO) | Admitting: Family

## 2022-10-12 ENCOUNTER — Encounter: Payer: Self-pay | Admitting: Family

## 2022-10-12 VITALS — BP 146/90 | HR 64 | Temp 96.1°F | Resp 16 | Ht 64.0 in | Wt 155.4 lb

## 2022-10-12 DIAGNOSIS — Z125 Encounter for screening for malignant neoplasm of prostate: Secondary | ICD-10-CM

## 2022-10-12 DIAGNOSIS — Z1159 Encounter for screening for other viral diseases: Secondary | ICD-10-CM

## 2022-10-12 DIAGNOSIS — T753XXA Motion sickness, initial encounter: Secondary | ICD-10-CM | POA: Diagnosis not present

## 2022-10-12 DIAGNOSIS — I10 Essential (primary) hypertension: Secondary | ICD-10-CM

## 2022-10-12 DIAGNOSIS — B009 Herpesviral infection, unspecified: Secondary | ICD-10-CM

## 2022-10-12 DIAGNOSIS — N529 Male erectile dysfunction, unspecified: Secondary | ICD-10-CM

## 2022-10-12 DIAGNOSIS — Z13220 Encounter for screening for lipoid disorders: Secondary | ICD-10-CM

## 2022-10-12 MED ORDER — LOSARTAN POTASSIUM 50 MG PO TABS: 50.0000 mg | ORAL_TABLET | Freq: Every day | ORAL | 1 refills | Status: AC

## 2022-10-12 MED ORDER — TADALAFIL 20 MG PO TABS: ORAL_TABLET | ORAL | 5 refills | Status: DC

## 2022-10-12 NOTE — Patient Instructions (Signed)

## 2022-10-12 NOTE — Progress Notes (Signed)
Provider: Marlowe Sax FNP-C  Skyah Hannon, Nelda Bucks, NP  Patient Care Team: Marie Borowski, Nelda Bucks, NP as PCP - General (Family Medicine)  Extended Emergency Contact Information Primary Emergency Contact: baldwin,sherry Mobile Phone: [phone] Relation: Significant other  Code Status:  Full Code  Goals of care: Advanced Directive information    10/12/2022    8:54 AM  Advanced Directives  Does Patient Have a Medical Advance Directive? No  Would patient like information on creating a medical advance directive? No - Patient declined     Chief Complaint  Patient presents with   Follow-up    Blood pressure     HPI:  Pt is a 62 y.o. male seen today for an acute visit for evaluation of high blood pressure.He was here 10/07/2022 his blood pressure 150/100.He denies any headache,dizziness,vision changes,fatigue,chest tightness,palpitation,chest pain or shortness of breath.       Past Medical History:  Diagnosis Date   Erectile dysfunction    Herpes genitalia    Past Surgical History:  Procedure Laterality Date   SALIVARY STONE REMOVAL  07/2021    No Known Allergies  Outpatient Encounter Medications as of 10/12/2022  Medication Sig   acyclovir (ZOVIRAX) 400 MG tablet Take 1 tablet (400 mg total) by mouth 2 (two) times daily.   fluticasone (FLONASE) 50 MCG/ACT nasal spray Place 2 sprays into both nostrils daily.   loratadine (CLARITIN) 10 MG tablet Take 1 tablet (10 mg total) by mouth daily.   meclizine (ANTIVERT) 25 MG tablet Take 1 tablet (25 mg total) by mouth 3 (three) times daily as needed for dizziness.   [DISCONTINUED] losartan (COZAAR) 25 MG tablet Take 1 tablet (25 mg total) by mouth daily.   [DISCONTINUED] tadalafil (CIALIS) 20 MG tablet TAKE 1 TABLET BY MOUTH ONCE DAILY AS NEEDED FOR ERECTILE DYSFUNCTION . APPOINTMENT REQUIRED FOR FUTURE REFILLS   losartan (COZAAR) 50 MG tablet Take 1 tablet (50 mg total) by mouth daily.   tadalafil (CIALIS) 20 MG tablet TAKE 1 TABLET  BY MOUTH ONCE DAILY AS NEEDED FOR ERECTILE DYSFUNCTION . APPOINTMENT REQUIRED FOR FUTURE REFILLS   No facility-administered encounter medications on file as of 10/12/2022.    Review of Systems  Constitutional:  Negative for appetite change, chills, fatigue, fever and unexpected weight change.  Eyes:  Negative for pain, discharge, redness, itching and visual disturbance.  Respiratory:  Negative for cough, chest tightness, shortness of breath and wheezing.   Cardiovascular:  Negative for chest pain, palpitations and leg swelling.  Gastrointestinal:  Negative for abdominal distention, abdominal pain, constipation, nausea and vomiting.  Genitourinary:  Negative for difficulty urinating, dysuria, flank pain, frequency and urgency.  Musculoskeletal:  Negative for arthralgias, back pain, gait problem, joint swelling and myalgias.  Skin:  Negative for color change, pallor and rash.  Neurological:  Negative for dizziness, syncope, speech difficulty, weakness, light-headedness, numbness and headaches.  Hematological:  Does not bruise/bleed easily.  Psychiatric/Behavioral:  Negative for agitation, behavioral problems, confusion and sleep disturbance. The patient is not nervous/anxious.      There is no immunization history on file for this patient. Pertinent  Health Maintenance Due  Topic Date Due   COLONOSCOPY (Pts 45-15yrs Insurance coverage will need to be confirmed)  Never done   INFLUENZA VACCINE  Discontinued      06/21/2021    8:00 PM 06/22/2021    7:45 AM 10/25/2021   10:23 AM 10/07/2022    3:36 PM 10/12/2022    8:54 AM  Fall Risk  Falls in  the past year?   0 0 0  Was there an injury with Fall?   0 0 0  Fall Risk Category Calculator   0 0 0  Fall Risk Category (Retired)   Low Low   (RETIRED) Patient Fall Risk Level Low fall risk Low fall risk Low fall risk Low fall risk   Patient at Risk for Falls Due to   No Fall Risks No Fall Risks No Fall Risks  Fall risk Follow up   Falls  evaluation completed Falls evaluation completed Falls evaluation completed   Functional Status Survey:    Vitals:   10/12/22 0848  BP: (!) 146/90  Pulse: 64  Resp: 16  Temp: (!) 96.1 F (35.6 C)  SpO2: 95%  Weight: 155 lb 6.4 oz (70.5 kg)  Height: 5\' 4"  (1.626 m)   Body mass index is 26.67 kg/m. Physical Exam Vitals reviewed.  Constitutional:      General: He is not in acute distress.    Appearance: Normal appearance. He is overweight. He is not ill-appearing or diaphoretic.  HENT:     Head: Normocephalic.     Mouth/Throat:     Mouth: Mucous membranes are moist.     Pharynx: Oropharynx is clear. No oropharyngeal exudate or posterior oropharyngeal erythema.  Eyes:     General: No scleral icterus.       Right eye: No discharge.        Left eye: No discharge.     Conjunctiva/sclera: Conjunctivae normal.     Pupils: Pupils are equal, round, and reactive to light.  Neck:     Vascular: No carotid bruit.  Cardiovascular:     Rate and Rhythm: Normal rate and regular rhythm.     Pulses: Normal pulses.     Heart sounds: Normal heart sounds. No murmur heard.    No friction rub. No gallop.  Pulmonary:     Effort: Pulmonary effort is normal. No respiratory distress.     Breath sounds: Normal breath sounds. No wheezing, rhonchi or rales.  Chest:     Chest wall: No tenderness.  Abdominal:     General: Bowel sounds are normal. There is no distension.     Palpations: Abdomen is soft. There is no mass.     Tenderness: There is no abdominal tenderness. There is no right CVA tenderness, left CVA tenderness, guarding or rebound.  Musculoskeletal:        General: No swelling or tenderness. Normal range of motion.     Cervical back: Normal range of motion. No rigidity or tenderness.     Right lower leg: No edema.     Left lower leg: No edema.  Lymphadenopathy:     Cervical: No cervical adenopathy.  Skin:    General: Skin is warm and dry.     Coloration: Skin is not pale.      Findings: No bruising, erythema, lesion or rash.  Neurological:     Mental Status: He is alert and oriented to person, place, and time.     Cranial Nerves: No cranial nerve deficit.     Sensory: No sensory deficit.     Motor: No weakness.     Coordination: Coordination normal.     Gait: Gait normal.  Psychiatric:        Mood and Affect: Mood normal.        Speech: Speech normal.        Behavior: Behavior normal.    Labs reviewed: Recent Labs  10/12/22 0936  NA 141  K 3.9  CL 105  CO2 27  GLUCOSE 82  BUN 16  CREATININE 1.15  CALCIUM 9.3   Recent Labs    10/12/22 0936  AST 28  ALT 18  BILITOT 0.7  PROT 6.9   Recent Labs    10/12/22 0936  WBC 5.8  NEUTROABS 2,796  HGB 14.4  HCT 42.8  MCV 93.0  PLT 219   No results found for: "TSH" No results found for: "HGBA1C" Lab Results  Component Value Date   CHOL 162 10/12/2022   HDL 69 10/12/2022   LDLCALC 81 10/12/2022   TRIG 42 10/12/2022   CHOLHDL 2.3 10/12/2022    Significant Diagnostic Results in last 30 days:  No results found.  Assessment/Plan 1. Prostate cancer screening Asymptomatic  - PSA, Total and Free  2. Encounter for hepatitis C screening test for low risk patient Low risk  - Hep C Antibody  3. Motion sickness, initial encounter Suspected due to high blood pressure.  - continue meclizine  - CBC with Differential/Platelet - COMPLETE METABOLIC PANEL WITH GFR  4. Herpes simplex Asymptomatic this visit  - continue on acyclovir  - CBC with Differential/Platelet - COMPLETE METABOLIC PANEL WITH GFR  5. Screening for hyperlipidemia - continue dietary modification and exercise  - Lipid panel  6. Essential hypertension B/p still not at gaol but has improved compared to previous  Increase Losartan from 25 mg tablet to 50 mg tablet daily.May use two tablets of current medication then pick up 50 mg tablet with next refill.  - losartan (COZAAR) 50 MG tablet; Take 1 tablet (50 mg total) by  mouth daily.  Dispense: 90 tablet; Refill: 1  7. Erectile dysfunction, unspecified erectile dysfunction type Continue on Cialis  - tadalafil (CIALIS) 20 MG tablet; TAKE 1 TABLET BY MOUTH ONCE DAILY AS NEEDED FOR ERECTILE DYSFUNCTION . APPOINTMENT REQUIRED FOR FUTURE REFILLS  Dispense: 30 tablet; Refill: 5  Family/ staff Communication: Reviewed plan of care with patient verbalized understanding   Labs/tests ordered: Has lab orders in place   Next Appointment: Return if symptoms worsen or fail to improve.   Sandrea Hughs, NP

## 2022-10-13 LAB — PSA, TOTAL AND FREE
PSA, % Free: 12 % (calc) — ABNORMAL LOW (ref >25)
PSA, Free: 0.6 ng/mL
PSA, Total: 4.9 ng/mL — ABNORMAL HIGH (ref <=4.0)

## 2022-10-13 LAB — COMPLETE METABOLIC PANEL WITH GFR
AG Ratio: 1.7 (calc) (ref 1.0–2.5)
ALT: 18 U/L (ref 9–46)
AST: 28 U/L (ref 10–35)
Albumin: 4.3 g/dL (ref 3.6–5.1)
Alkaline phosphatase (APISO): 48 U/L (ref 35–144)
BUN: 16 mg/dL (ref 7–25)
CO2: 27 mmol/L (ref 20–32)
Calcium: 9.3 mg/dL (ref 8.6–10.3)
Chloride: 105 mmol/L (ref 98–110)
Creat: 1.15 mg/dL (ref 0.70–1.35)
Globulin: 2.6 g/dL (calc) (ref 1.9–3.7)
Glucose, Bld: 82 mg/dL (ref 65–99)
Potassium: 3.9 mmol/L (ref 3.5–5.3)
Sodium: 141 mmol/L (ref 135–146)
Total Bilirubin: 0.7 mg/dL (ref 0.2–1.2)
Total Protein: 6.9 g/dL (ref 6.1–8.1)
eGFR: 72 mL/min/{1.73_m2} (ref >=60)

## 2022-10-13 LAB — CBC WITH DIFFERENTIAL/PLATELET
Absolute Monocytes: 534 cells/uL (ref 200–950)
Basophils Absolute: 41 cells/uL (ref 0–200)
Basophils Relative: 0.7 %
Eosinophils Absolute: 99 cells/uL (ref 15–500)
Eosinophils Relative: 1.7 %
HCT: 42.8 % (ref 38.5–50.0)
Hemoglobin: 14.4 g/dL (ref 13.2–17.1)
Lymphs Abs: 2332 cells/uL (ref 850–3900)
MCH: 31.3 pg (ref 27.0–33.0)
MCHC: 33.6 g/dL (ref 32.0–36.0)
MCV: 93 fL (ref 80.0–100.0)
MPV: 11.3 fL (ref 7.5–12.5)
Monocytes Relative: 9.2 %
Neutro Abs: 2796 cells/uL (ref 1500–7800)
Neutrophils Relative %: 48.2 %
Platelets: 219 10*3/uL (ref 140–400)
RBC: 4.6 10*6/uL (ref 4.20–5.80)
RDW: 12.1 % (ref 11.0–15.0)
Total Lymphocyte: 40.2 %
WBC: 5.8 10*3/uL (ref 3.8–10.8)

## 2022-10-13 LAB — LIPID PANEL
Cholesterol: 162 mg/dL (ref <200)
HDL: 69 mg/dL (ref >=40)
LDL Cholesterol (Calc): 81 mg/dL (calc)
Non-HDL Cholesterol (Calc): 93 mg/dL (calc) (ref <130)
Total CHOL/HDL Ratio: 2.3 (calc) (ref <5.0)
Triglycerides: 42 mg/dL (ref <150)

## 2022-10-13 LAB — HEPATITIS C ANTIBODY: Hepatitis C Ab: NONREACTIVE

## 2022-10-16 DIAGNOSIS — N529 Male erectile dysfunction, unspecified: Secondary | ICD-10-CM | POA: Insufficient documentation

## 2022-10-16 DIAGNOSIS — I10 Essential (primary) hypertension: Secondary | ICD-10-CM | POA: Insufficient documentation

## 2022-12-22 ENCOUNTER — Ambulatory Visit (INDEPENDENT_AMBULATORY_CARE_PROVIDER_SITE_OTHER): Payer: Managed Care, Other (non HMO)

## 2022-12-22 ENCOUNTER — Ambulatory Visit (HOSPITAL_COMMUNITY)
Admission: EM | Admit: 2022-12-22 | Discharge: 2022-12-22 | Disposition: A | Discharge status: Home / Self Care | Payer: Managed Care, Other (non HMO) | Attending: Physician Assistant | Admitting: Physician Assistant

## 2022-12-22 ENCOUNTER — Encounter (HOSPITAL_COMMUNITY): Payer: Self-pay

## 2022-12-22 DIAGNOSIS — M7989 Other specified soft tissue disorders: Secondary | ICD-10-CM | POA: Diagnosis not present

## 2022-12-22 DIAGNOSIS — M25512 Pain in left shoulder: Secondary | ICD-10-CM

## 2022-12-22 LAB — COMPREHENSIVE METABOLIC PANEL
ALT: 20 U/L (ref 0–44)
AST: 32 U/L (ref 15–41)
Albumin: 4.1 g/dL (ref 3.5–5.0)
Alkaline Phosphatase: 48 U/L (ref 38–126)
Anion gap: 8 (ref 5–15)
BUN: 13 mg/dL (ref 8–23)
CO2: 27 mmol/L (ref 22–32)
Calcium: 9.7 mg/dL (ref 8.9–10.3)
Chloride: 104 mmol/L (ref 98–111)
Creatinine, Ser: 1.26 mg/dL — ABNORMAL HIGH (ref 0.61–1.24)
GFR, Estimated: >60 mL/min (ref >60)
Glucose, Bld: 97 mg/dL (ref 70–99)
Potassium: 4.4 mmol/L (ref 3.5–5.1)
Sodium: 139 mmol/L (ref 135–145)
Total Bilirubin: 0.8 mg/dL (ref 0.3–1.2)
Total Protein: 7.6 g/dL (ref 6.5–8.1)

## 2022-12-22 LAB — CBC WITH DIFFERENTIAL/PLATELET
Abs Immature Granulocytes: 0.04 10*3/uL (ref 0.00–0.07)
Basophils Absolute: 0 10*3/uL (ref 0.0–0.1)
Basophils Relative: 1 %
Eosinophils Absolute: 0.1 10*3/uL (ref 0.0–0.5)
Eosinophils Relative: 1 %
HCT: 47.1 % (ref 39.0–52.0)
Hemoglobin: 15.6 g/dL (ref 13.0–17.0)
Immature Granulocytes: 1 %
Lymphocytes Relative: 44 %
Lymphs Abs: 2.7 10*3/uL (ref 0.7–4.0)
MCH: 31.1 pg (ref 26.0–34.0)
MCHC: 33.1 g/dL (ref 30.0–36.0)
MCV: 93.8 fL (ref 80.0–100.0)
Monocytes Absolute: 0.5 10*3/uL (ref 0.1–1.0)
Monocytes Relative: 8 %
Neutro Abs: 2.8 10*3/uL (ref 1.7–7.7)
Neutrophils Relative %: 45 %
Platelets: 229 10*3/uL (ref 150–400)
RBC: 5.02 MIL/uL (ref 4.22–5.81)
RDW: 11.9 % (ref 11.5–15.5)
WBC: 6.2 10*3/uL (ref 4.0–10.5)
nRBC: 0 % (ref 0.0–0.2)

## 2022-12-22 NOTE — ED Triage Notes (Signed)
Pt states that he noticed a knot on his collar bone on left side. No pian with it. It is swelling. Hasn't taken anything.

## 2022-12-22 NOTE — ED Provider Notes (Addendum)
[place]    CSN: [id number] Arrival date & time: 12/22/22  1700      History   Chief Complaint Chief Complaint  Patient presents with   knot on collor bone    HPI Dustin Powell is a 62 y.o. male.   Patient presents today with a several day history of swelling over his medial left clavicle.  He denies any known injury increase in activity prior to symptom onset.  Reports that symptoms have been stable since onset and have not worsened or spread.  He denies any associated pain but does feel a sensation of fullness/tightness particularly with movement of his neck or shoulder in that area.  He has not tried any over-the-counter medication.  Denies any associated shortness of breath, chest discomfort, neck pain, dysphagia.  He denies history of malignancy.  Denies any night sweats, unintentional weight loss, unexplained fevers.  Denies any additional lesions or symptoms.  He does not currently have a primary care provider.    Past Medical History:  Diagnosis Date   Erectile dysfunction    Herpes genitalia     Patient Active Problem List   Diagnosis Date Noted   Primary hypertension 10/16/2022   Erectile dysfunction 10/16/2022   Fever     Past Surgical History:  Procedure Laterality Date   SALIVARY STONE REMOVAL  07/2021       Home Medications    Prior to Admission medications   Medication Sig Start Date End Date Taking? Authorizing Provider  acyclovir (ZOVIRAX) 400 MG tablet Take 1 tablet (400 mg total) by mouth 2 (two) times daily. 10/07/22  Yes Ngetich, Dinah C, NP  fluticasone (FLONASE) 50 MCG/ACT nasal spray Place 2 sprays into both nostrils daily. 10/25/21  Yes Ngetich, Dinah C, NP  loratadine (CLARITIN) 10 MG tablet Take 1 tablet (10 mg total) by mouth daily. 10/25/21  Yes Ngetich, Dinah C, NP  losartan (COZAAR) 50 MG tablet Take 1 tablet (50 mg total) by mouth daily. 10/12/22  Yes Ngetich, Dinah C, NP  tadalafil (CIALIS) 20 MG tablet TAKE 1 TABLET BY  MOUTH ONCE DAILY AS NEEDED FOR ERECTILE DYSFUNCTION . APPOINTMENT REQUIRED FOR FUTURE REFILLS 10/12/22  Yes Ngetich, Dinah C, NP    Family History Family History  Problem Relation Age of Onset   Colon polyps Neg Hx    Colon cancer Neg Hx    Esophageal cancer Neg Hx    Stomach cancer Neg Hx    Rectal cancer Neg Hx     Social History Social History   Tobacco Use   Smoking status: Never   Smokeless tobacco: Never  Vaping Use   Vaping Use: Never used  Substance Use Topics   Alcohol use: Yes    Alcohol/week: 6.0 standard drinks of alcohol    Types: 6 Cans of beer per week    Comment: weekends 6 pack   Drug use: Never     Allergies   Patient has no known allergies.   Review of Systems Review of Systems  Constitutional:  Negative for activity change, appetite change, chills, diaphoresis, fatigue, fever and unexpected weight change.  Respiratory:  Negative for cough and shortness of breath.   Cardiovascular:  Negative for chest pain.  Gastrointestinal:  Negative for abdominal pain.  Musculoskeletal:  Positive for joint swelling. Negative for arthralgias and myalgias.     Physical Exam Triage Vital Signs ED Triage Vitals  Enc Vitals Group     BP 12/22/22 1820 113/76  Pulse Rate 12/22/22 1820 68     Resp 12/22/22 1820 18     Temp 12/22/22 1820 98.4 F (36.9 C)     Temp Source 12/22/22 1820 Oral     SpO2 12/22/22 1820 96 %     Weight 12/22/22 1819 160 lb (72.6 kg)     Height --      Head Circumference --      Peak Flow --      Pain Score 12/22/22 1819 0     Pain Loc --      Pain Edu? --      Excl. in [place]? --    No data found.  Updated Vital Signs BP 113/76 (BP Location: Right Arm)   Pulse 68   Temp 98.4 F (36.9 C) (Oral)   Resp 18   Wt 160 lb (72.6 kg)   SpO2 96%   BMI 27.46 kg/m   Visual Acuity Right Eye Distance:   Left Eye Distance:   Bilateral Distance:    Right Eye Near:   Left Eye Near:    Bilateral Near:     Physical Exam Vitals  reviewed.  Constitutional:      General: He is awake.     Appearance: Normal appearance. He is well-developed. He is not ill-appearing.     Comments: Very pleasant male appears stated age in no acute distress sitting comfortably in exam room  HENT:     Head: Normocephalic and atraumatic.  Cardiovascular:     Rate and Rhythm: Normal rate and regular rhythm.     Heart sounds: Normal heart sounds, S1 normal and S2 normal. No murmur heard. Pulmonary:     Effort: Pulmonary effort is normal.     Breath sounds: Normal breath sounds. No stridor. No wheezing, rhonchi or rales.     Comments: Clear to auscultation bilaterally Chest:     Chest wall: Deformity and swelling present. No tenderness.       Comments: Swelling and deformity of medial left clavicle.  No significant tenderness palpation.  Normal active range of motion of bilateral shoulders.  Strength 5/5 bilateral upper extremities. Abdominal:     General: Bowel sounds are normal.     Palpations: Abdomen is soft.     Tenderness: There is no abdominal tenderness.  Lymphadenopathy:     Upper Body:     Right upper body: No supraclavicular adenopathy.     Left upper body: No supraclavicular adenopathy.  Neurological:     Mental Status: He is alert.  Psychiatric:        Behavior: Behavior is cooperative.      UC Treatments / Results  Labs (all labs ordered are listed, but only abnormal results are displayed) Labs Reviewed  CBC WITH DIFFERENTIAL/PLATELET  COMPREHENSIVE METABOLIC PANEL    EKG   Radiology DG Clavicle Left  Result Date: 12/22/2022 CLINICAL DATA:  Swelling and deformity of the medial collar bone. EXAM: LEFT CLAVICLE - 2+ VIEWS COMPARISON:  Chest 06/21/2021 FINDINGS: Mild degenerative changes in the acromioclavicular joint. Coracoclavicular space is normal. No evidence of acute fracture or dislocation of the left clavicle. No focal bone lesion is identified. Soft tissues are unremarkable. IMPRESSION: Degenerative  changes in the acromioclavicular joint. No acute bony abnormality or focal bone lesion identified in the clavicle. Electronically Signed   By: Lucienne Capers M.D.   On: 12/22/2022 19:22    Procedures Procedures (including critical care time)  Medications Ordered in UC Medications - No data to display  Initial Impression / Assessment and Plan / UC Course  I have reviewed the triage vital signs and the nursing notes.  Pertinent labs & imaging results that were available during my care of the patient were reviewed by me and considered in my medical decision making (see chart for details).     Patient is well-appearing, afebrile, nontoxic, nontachycardic.  X-ray was obtained that showed degenerative changes at [facility] joint but no acute bony abnormalities over area of swelling.  Given age we will try to obtain ultrasound.  Discussed that we typically are unable to obtain these in urgent care but order was placed since he does not currently have a primary care provider.  We will also try to establish him with someone via PCP assistance.  Basic labs including CBC and CMP were obtained today and are pending.  We discussed that if he has any changing or worsening symptoms including increasing swelling, unintentional weight loss, fever, nausea/vomiting interfering with oral intake, dysphagia, shortness of breath he needs to be seen immediately.  Recommended conservative treatment options including ice and ibuprofen.  Discussed case with Dr. Mannie Stabile who agreed with treatment plan.  Final Clinical Impressions(s) / UC Diagnoses   Final diagnoses:  Swelling of clavicular region     Discharge Instructions      Your x-ray was normal.  I do think we need to do additional imaging.  I have ordered an ultrasound but I am not sure if we will be able to get this.  Someone should call you to schedule it if we are able to arrange it.  I also want you to follow-up with primary care.  Ice the area and use ibuprofen  for pain and swelling.  If anything changes and you have increasing swelling, increasing pain, fevers, night sweats, weight loss, difficulty swallowing, shortness of breath you need to go to the emergency room.     ED Prescriptions   None    PDMP not reviewed this encounter.   Terrilee Croak, PA-C 12/22/22 2008    Jorene Kaylor K, [place] 12/22/22 2008

## 2022-12-22 NOTE — Discharge Instructions (Addendum)
Your x-ray was normal.  I do think we need to do additional imaging.  I have ordered an ultrasound but I am not sure if we will be able to get this.  Someone should call you to schedule it if we are able to arrange it.  I also want you to follow-up with primary care.  Ice the area and use ibuprofen for pain and swelling.  If anything changes and you have increasing swelling, increasing pain, fevers, night sweats, weight loss, difficulty swallowing, shortness of breath you need to go to the emergency room.

## 2023-02-06 IMAGING — DX DG CHEST 2V
2 series · 2 of 2 positions shown · non-contrast
Comparison: None.

CLINICAL DATA: Fever

EXAM:
CHEST - 2 VIEW

[chest pa]
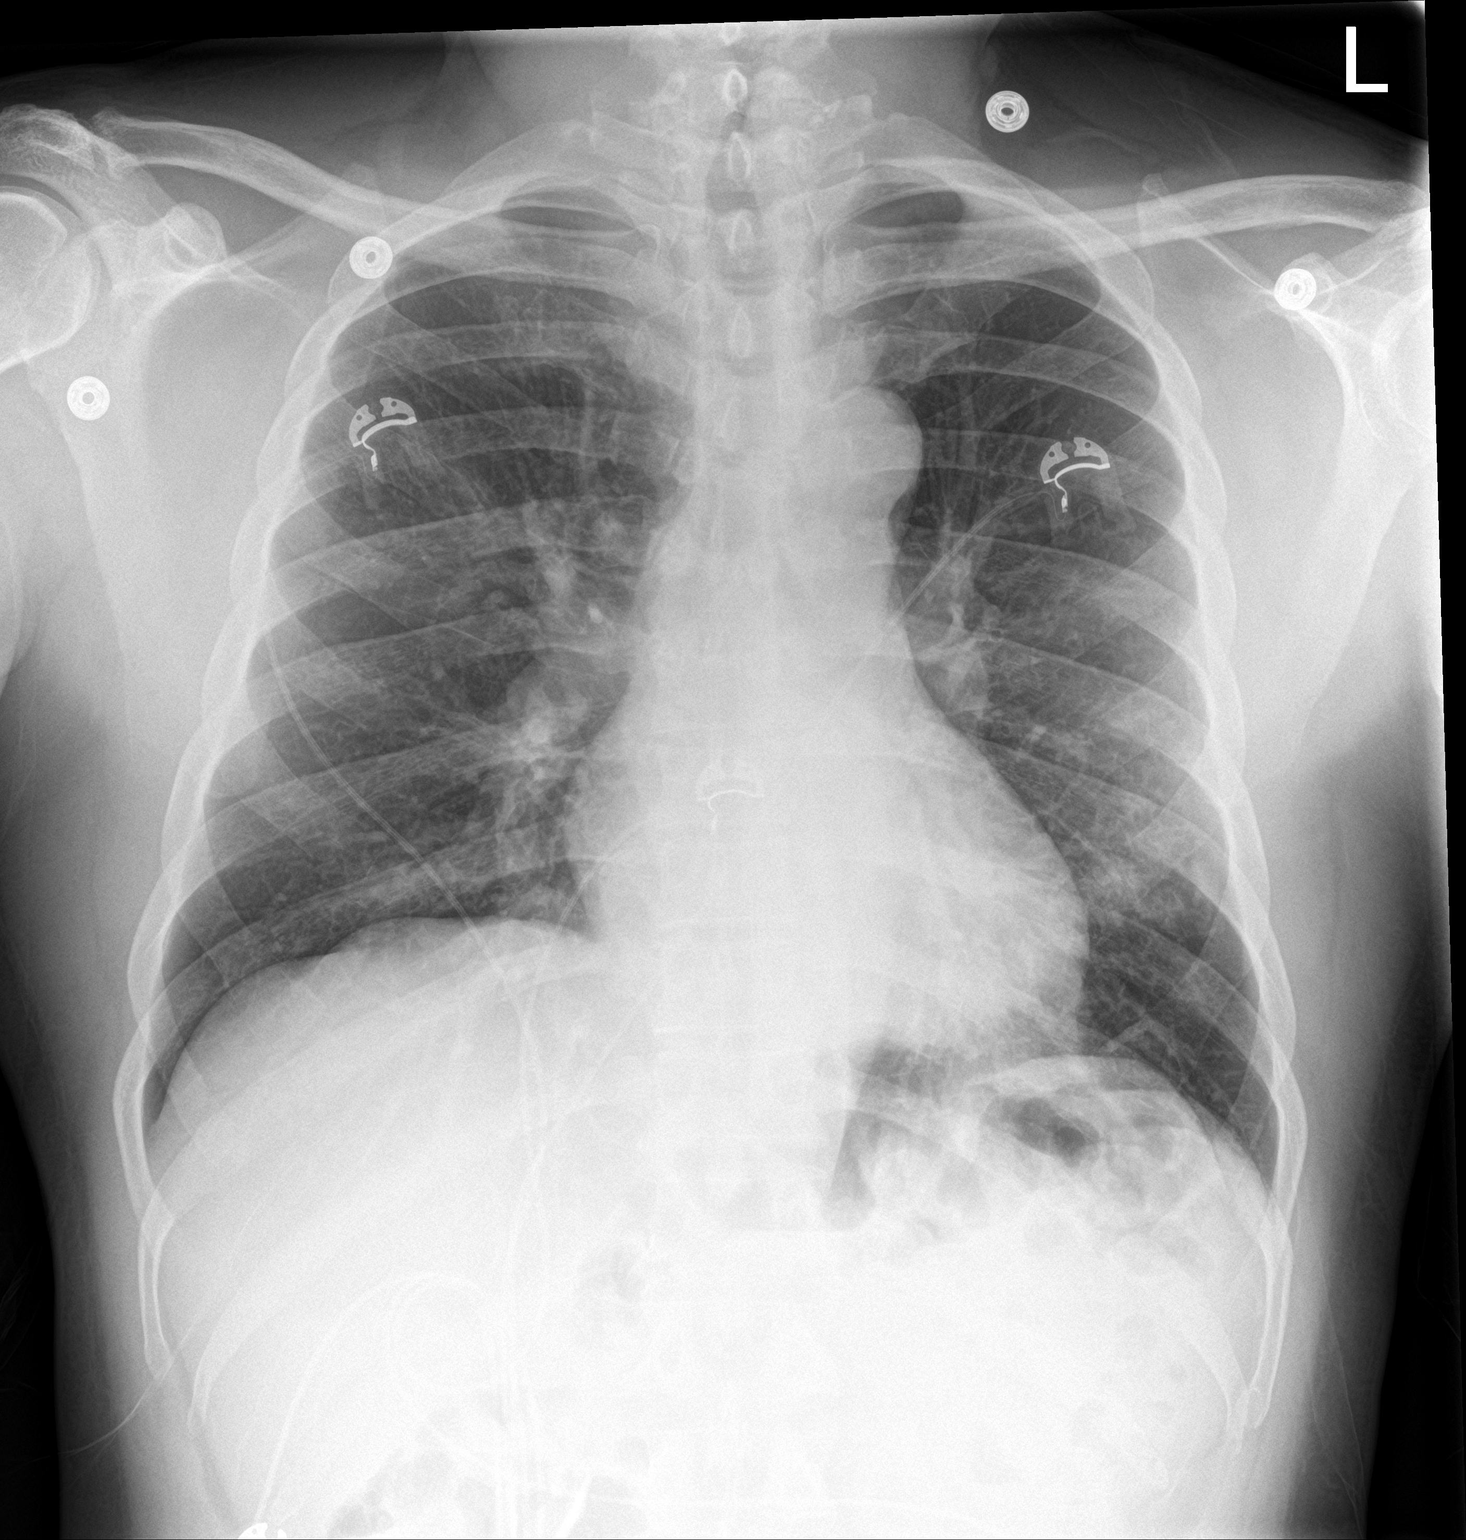

[chest lat]
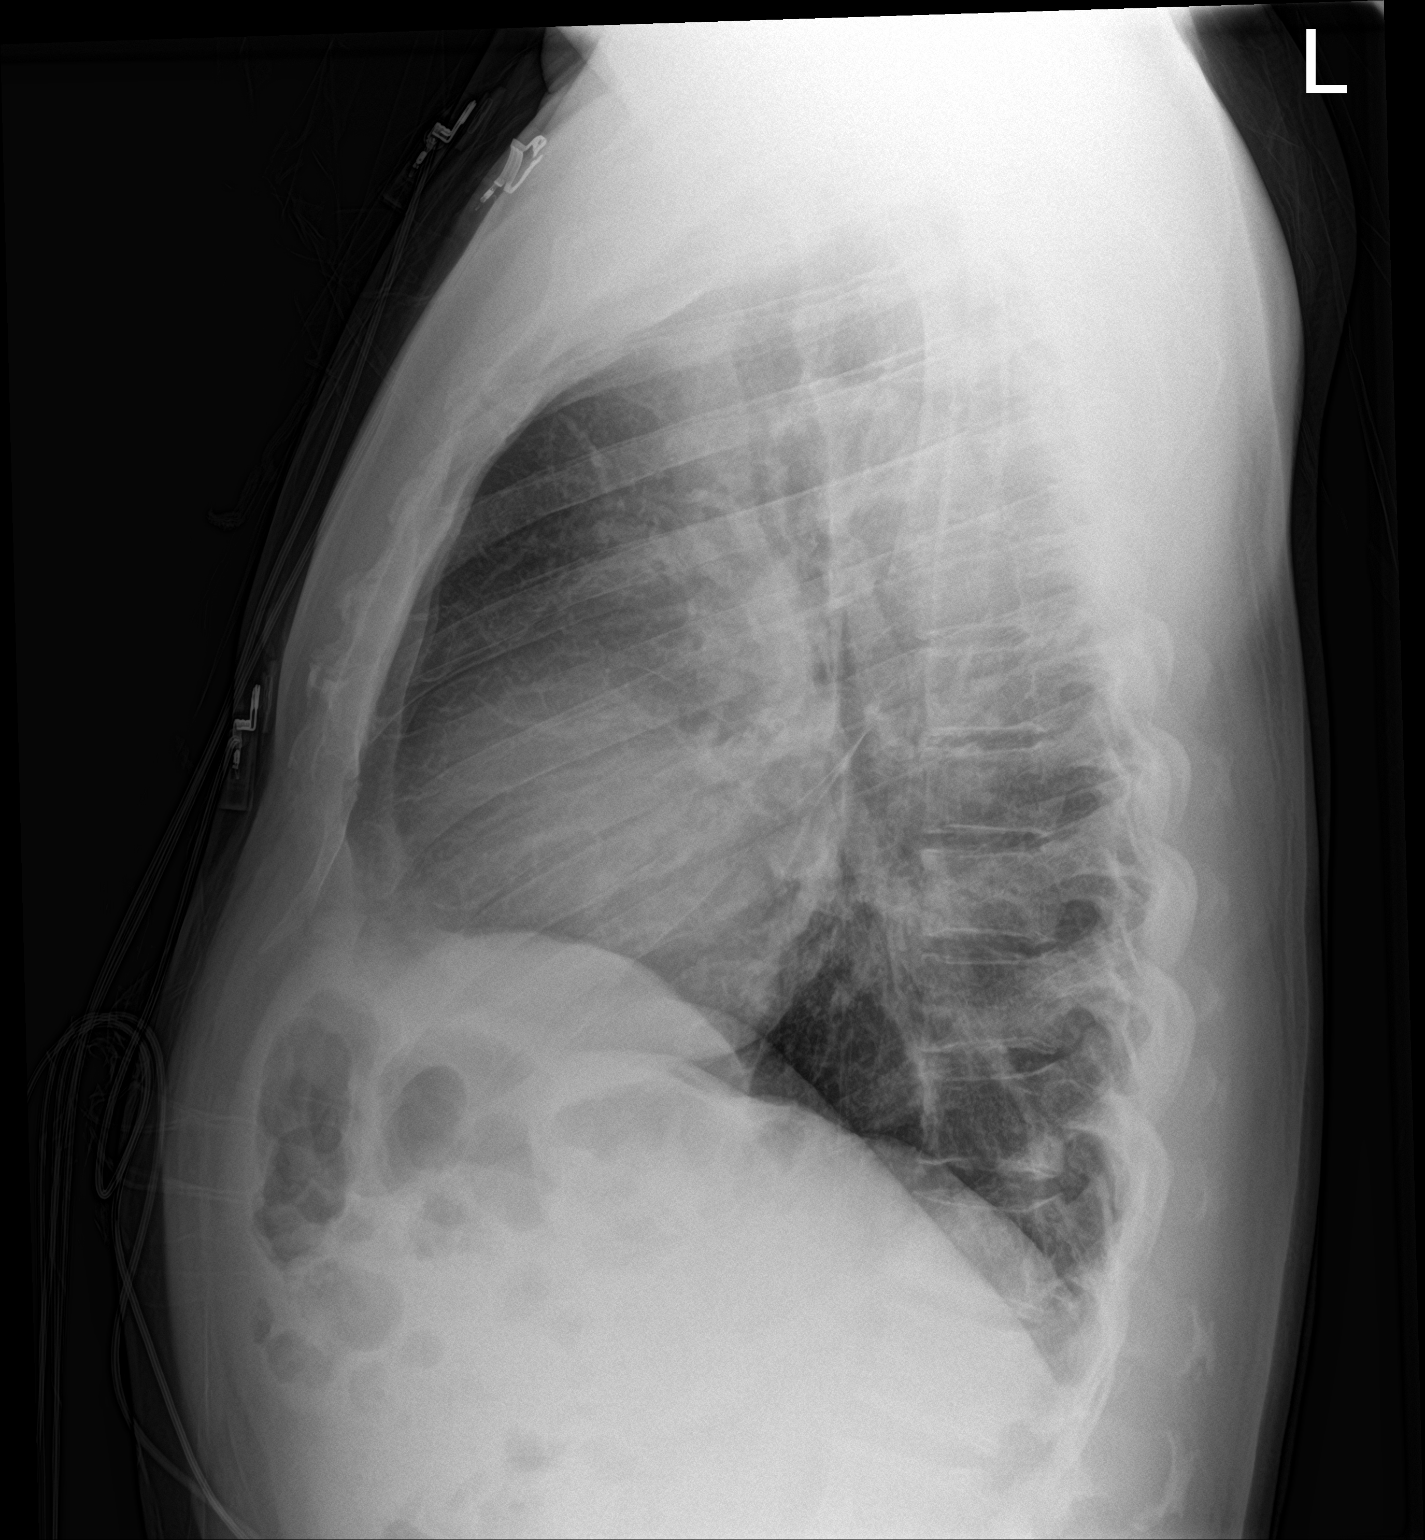

[2 of 2 positions shown; findings below may reference images not displayed]

FINDINGS: Normal size cardiac silhouette. No mediastinal or hilar masses. No
evidence of adenopathy.

Small patchy areas of airspace opacity noted in the left mid lung.
Remainder of the lungs is clear.

No pleural effusion or pneumothorax.

Skeletal structures are intact.
IMPRESSION: 1. Subtle small areas of airspace opacity in the left mid lung
consistent with pneumonia in the proper clinical setting. This
finding could be chronic, however. No other evidence of acute
cardiopulmonary disease.

## 2023-02-07 IMAGING — DX DG CHEST 1V PORT
1 series · 1 of 1 positions shown · non-contrast
Comparison: 06/20/2021

CLINICAL DATA: Fever, neck pain

EXAM:
PORTABLE CHEST 1 VIEW

[chest]
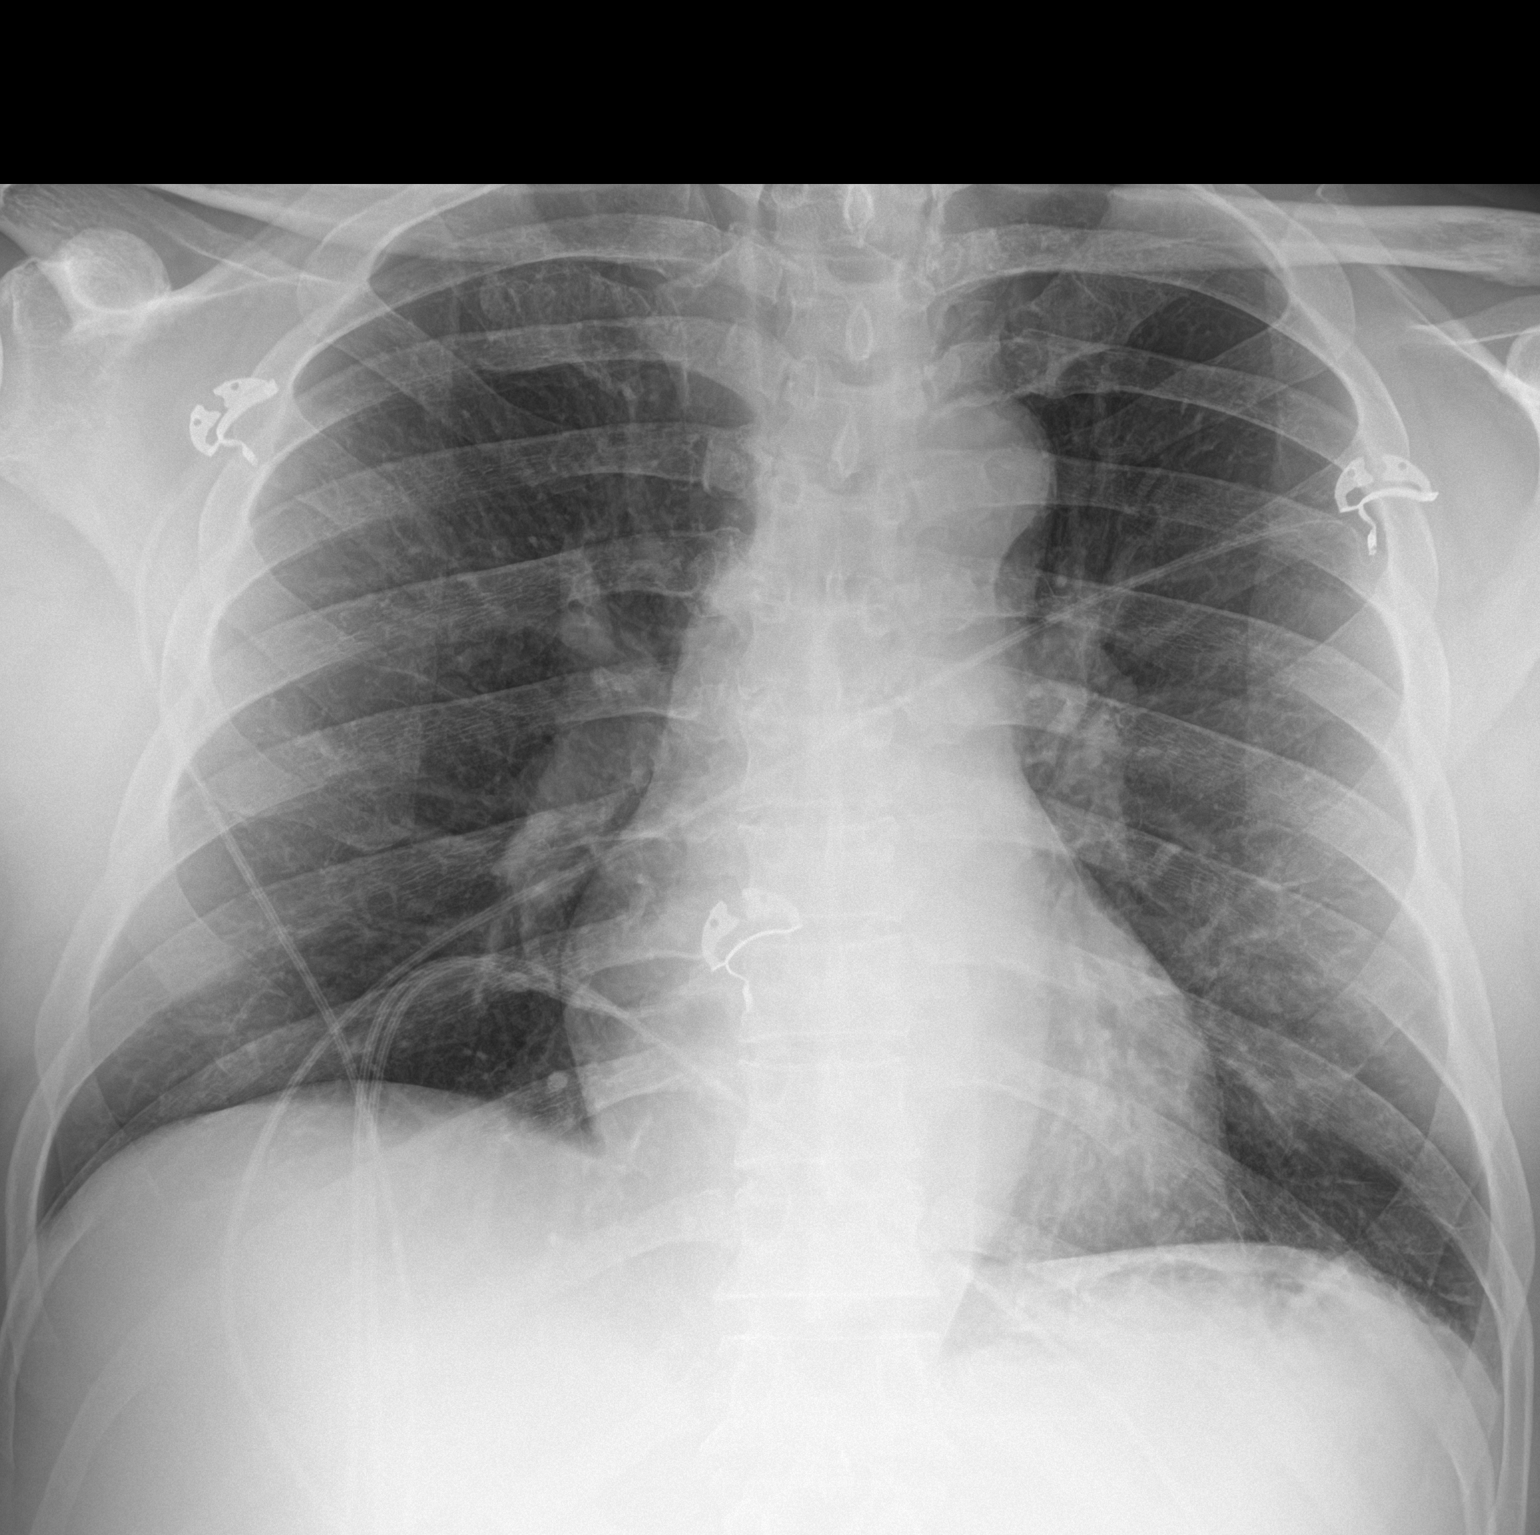

[1 of 1 positions shown; findings below may reference images not displayed]

FINDINGS: Single frontal view of the chest demonstrates an unremarkable
cardiac silhouette. The density seen at the left lung base on prior
study have improved in the interim, with only minimal linear
densities remaining. I would favor hypoventilatory changes or
chronic scarring rather than acute airspace disease. No effusion or
pneumothorax. No acute bony abnormalities.
IMPRESSION: 1. Interval improvement in the left basilar density seen on prior
study, favor residual atelectasis or chronic scarring.
2. No new process.

## 2023-05-15 ENCOUNTER — Encounter: Payer: Self-pay | Admitting: Family

## 2023-05-15 ENCOUNTER — Ambulatory Visit (INDEPENDENT_AMBULATORY_CARE_PROVIDER_SITE_OTHER): Payer: Managed Care, Other (non HMO) | Admitting: Family

## 2023-05-15 VITALS — BP 122/78 | HR 61 | Temp 97.2°F | Resp 18 | Ht 64.0 in | Wt 144.2 lb

## 2023-05-15 DIAGNOSIS — M436 Torticollis: Secondary | ICD-10-CM | POA: Diagnosis not present

## 2023-05-15 DIAGNOSIS — G8929 Other chronic pain: Secondary | ICD-10-CM

## 2023-05-15 DIAGNOSIS — M25512 Pain in left shoulder: Secondary | ICD-10-CM | POA: Diagnosis not present

## 2023-05-15 MED ORDER — IBUPROFEN 600 MG PO TABS: 600.0000 mg | ORAL_TABLET | Freq: Three times a day (TID) | ORAL | 0 refills | Status: AC | PRN

## 2023-05-15 MED ORDER — METHOCARBAMOL 500 MG PO TABS: 500.0000 mg | ORAL_TABLET | Freq: Three times a day (TID) | ORAL | 0 refills | Status: AC | PRN

## 2023-05-15 NOTE — Patient Instructions (Signed)
-   Apply warm compressor to left shoulder 15-20 minutes daily to relief stiffness  - Notify provider if symptoms worsen or fail to improve

## 2023-05-15 NOTE — Progress Notes (Signed)
Provider: Richarda Blade FNP-C  Adelyn Roscher, Donalee Citrin, NP  Patient Care Team: Bracha Frankowski, Donalee Citrin, NP as PCP - General (Family Medicine)  Extended Emergency Contact Information Primary Emergency Contact: baldwin,sherry Mobile Phone: [phone] Relation: Significant other  Code Status:  Full code  Goals of care: Advanced Directive information    05/15/2023    8:29 AM  Advanced Directives  Does Patient Have a Medical Advance Directive? No  Would patient like information on creating a medical advance directive? No - Patient declined     Chief Complaint  Patient presents with   Acute Visit    stiffness and spasms in left shoulder and leg    HPI:  Pt is a 62 y.o. male seen today for an acute visit for evaluation of left shoulder spasm and stiffness.Neck stiff when trying to turn from side to side.  Does push ups and lifts weight 25 lbs for exercise.He does not recall any injuries to shoulder or neck.He denies any weakness,numbness or tingling on the hands.  Also complains of node on upper sternum. That he noticed few night ago.states non-tender or red.Previous X-ray 12/22/2022 clavicle showed degenerative changes in the [facility] joint.No acute abnormalities. 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   Has had some pain on the right leg and right arm.pain comes and goes within few minutes during the night.  Past Medical History:  Diagnosis Date   Erectile dysfunction    Herpes genitalia    Past Surgical History:  Procedure Laterality Date   SALIVARY STONE REMOVAL  07/2021    No Known Allergies  Outpatient Encounter Medications as of 05/15/2023  Medication Sig   acyclovir (ZOVIRAX) 400 MG tablet Take 1 tablet (400 mg total) by mouth 2 (two) times daily.   fluticasone (FLONASE) 50 MCG/ACT nasal spray Place 2 sprays into both nostrils daily.   loratadine (CLARITIN) 10 MG tablet Take 1 tablet (10 mg total) by mouth daily.   losartan (COZAAR) 50 MG tablet Take 1 tablet (50 mg  total) by mouth daily.   tadalafil (CIALIS) 20 MG tablet TAKE 1 TABLET BY MOUTH ONCE DAILY AS NEEDED FOR ERECTILE DYSFUNCTION . APPOINTMENT REQUIRED FOR FUTURE REFILLS   No facility-administered encounter medications on file as of 05/15/2023.    Review of Systems  Constitutional:  Negative for appetite change, chills, fatigue, fever and unexpected weight change.  Respiratory:  Negative for cough, chest tightness, shortness of breath and wheezing.   Cardiovascular:  Negative for chest pain, palpitations and leg swelling.  Musculoskeletal:  Positive for arthralgias. Negative for back pain, gait problem, joint swelling, myalgias, neck pain and neck stiffness.       Left shoulder/neck stiffness   Skin:  Negative for color change, pallor, rash and wound.  Neurological:  Negative for dizziness, weakness, light-headedness, numbness and headaches.     There is no immunization history on file for this patient. Pertinent  Health Maintenance Due  Topic Date Due   Colonoscopy  Never done   INFLUENZA VACCINE  Discontinued      06/21/2021    8:00 PM 06/22/2021    7:45 AM 10/25/2021   10:23 AM 10/07/2022    3:36 PM 10/12/2022    8:54 AM  Fall Risk  Falls in the past year?   0 0 0  Was there an injury with Fall?   0 0 0  Fall Risk Category Calculator   0 0 0  Fall Risk Category (Retired)   Low Low   (  RETIRED) Patient Fall Risk Level Low fall risk Low fall risk Low fall risk Low fall risk   Patient at Risk for Falls Due to   No Fall Risks No Fall Risks No Fall Risks  Fall risk Follow up   Falls evaluation completed Falls evaluation completed Falls evaluation completed   Functional Status Survey:    Vitals:   05/15/23 0828  BP: 122/78  Pulse: 61  Resp: 18  Temp: (!) 97.2 F (36.2 C)  SpO2: 96%  Weight: 144 lb 3.2 oz (65.4 kg)  Height: 5\' 4"  (1.626 m)   Body mass index is 24.75 kg/m. Physical Exam Vitals reviewed.  Constitutional:      General: He is not in acute distress.     Appearance: Normal appearance. He is normal weight. He is not ill-appearing or diaphoretic.  HENT:     Head: Normocephalic.  Eyes:     General: No scleral icterus.       Right eye: No discharge.        Left eye: No discharge.     Conjunctiva/sclera: Conjunctivae normal.     Pupils: Pupils are equal, round, and reactive to light.  Cardiovascular:     Rate and Rhythm: Normal rate and regular rhythm.     Pulses: Normal pulses.     Heart sounds: Normal heart sounds. No murmur heard.    No friction rub. No gallop.  Pulmonary:     Effort: Pulmonary effort is normal. No respiratory distress.     Breath sounds: Normal breath sounds. No wheezing, rhonchi or rales.  Chest:     Chest wall: No tenderness.  Musculoskeletal:        General: No swelling or tenderness. Normal range of motion.     Right shoulder: Normal.     Left shoulder: No swelling, effusion, tenderness or crepitus. Normal strength. Normal pulse.       Arms:     Cervical back: Normal range of motion. No rigidity or tenderness.     Right lower leg: No edema.     Left lower leg: No edema.  Lymphadenopathy:     Cervical: No cervical adenopathy.  Skin:    General: Skin is warm and dry.     Coloration: Skin is not pale.     Findings: No erythema or rash.  Neurological:     Mental Status: He is alert and oriented to person, place, and time.     Cranial Nerves: No cranial nerve deficit.     Sensory: No sensory deficit.     Motor: No weakness.     Coordination: Coordination normal.     Gait: Gait normal.  Psychiatric:        Mood and Affect: Mood normal.        Speech: Speech normal.        Behavior: Behavior normal.     Labs reviewed: Recent Labs    10/12/22 0936 12/22/22 1946  NA 141 139  K 3.9 4.4  CL 105 104  CO2 27 27  GLUCOSE 82 97  BUN 16 13  CREATININE 1.15 1.26*  CALCIUM 9.3 9.7   Recent Labs    10/12/22 0936 12/22/22 1946  AST 28 32  ALT 18 20  ALKPHOS  --  48  BILITOT 0.7 0.8  PROT 6.9 7.6   ALBUMIN  --  4.1   Recent Labs    10/12/22 0936 12/22/22 1946  WBC 5.8 6.2  NEUTROABS 2,796 2.8  HGB 14.4 15.6  HCT 42.8 47.1  MCV 93.0 93.8  PLT 219 229   No results found for: "TSH" No results found for: "HGBA1C" Lab Results  Component Value Date   CHOL 162 10/12/2022   HDL 69 10/12/2022   LDLCALC 81 10/12/2022   TRIG 42 10/12/2022   CHOLHDL 2.3 10/12/2022    Significant Diagnostic Results in last 30 days:  No results found.  Assessment/Plan 1. Chronic left shoulder pain Left sternum - clavicular joint raised,non-tender and without any erythema.worrisome for previous injury though patient denies any injury to site or shoulder. - Trapezius muscle tight and tender to palpation  - methocarbamol (ROBAXIN) 500 MG tablet; Take 1 tablet (500 mg total) by mouth every 8 (eight) hours as needed for muscle spasms.  Dispense: 30 tablet; Refill: 0 - ibuprofen (ADVIL) 600 MG tablet; Take 1 tablet (600 mg total) by mouth every 8 (eight) hours as needed.  Dispense: 30 tablet; Refill: 0 - Ambulatory referral to Orthopedic Surgery  2. Stiffness of neck - Trapezius muscle tight and tender to palpation  - advised to apply warm compressor for 15 -20 minutes daily to relax muscle and relief pain   - stretching exercises advised prior to going to work and after lifting heavy loads. - Notfiy provider if symptoms worsen or fail to improve.  - methocarbamol (ROBAXIN) 500 MG tablet; Take 1 tablet (500 mg total) by mouth every 8 (eight) hours as needed for muscle spasms.  Dispense: 30 tablet; Refill: 0 - ibuprofen (ADVIL) 600 MG tablet; Take 1 tablet (600 mg total) by mouth every 8 (eight) hours as needed.  Dispense: 30 tablet; Refill: 0  Family/ staff Communication: Reviewed plan of care with patient verbalized understanding   Labs/tests ordered: None   Next Appointment: Return if symptoms worsen or fail to improve.   Caesar Bookman, NP

## 2023-05-24 ENCOUNTER — Ambulatory Visit (INDEPENDENT_AMBULATORY_CARE_PROVIDER_SITE_OTHER): Payer: Managed Care, Other (non HMO) | Admitting: Orthopaedic Surgery

## 2023-05-24 ENCOUNTER — Encounter: Payer: Self-pay | Admitting: Orthopaedic Surgery

## 2023-05-24 DIAGNOSIS — M19012 Primary osteoarthritis, left shoulder: Secondary | ICD-10-CM | POA: Diagnosis not present

## 2023-05-24 MED ORDER — PREDNISONE 10 MG (21) PO TBPK: ORAL_TABLET | ORAL | 3 refills | Status: AC

## 2023-05-24 MED ORDER — NAPROXEN 500 MG PO TABS: 500.0000 mg | ORAL_TABLET | Freq: Two times a day (BID) | ORAL | 3 refills | Status: AC

## 2023-05-24 NOTE — Progress Notes (Signed)
Office Visit Note   Patient: Dustin Powell           Date of Birth: 04/08/1961           MRN: [id number] Visit Date: 05/24/2023              Requested by: Caesar Bookman, NP [address] [place],  [place] [address] PCP: Caesar Bookman, NP   Assessment & Plan: Visit Diagnoses:  1. Arthritis of left sternoclavicular joint     Plan: Dustin Powell is a 62 year old gentleman with symptomatic left AC joint arthritis flare.  I have recommended activity modification and avoidance of push-ups and shoulder presses until his symptoms improved.  We will start with prednisone Dosepak and 2 weeks of naproxen to hopefully settle things down.  If symptoms do not improve he could consider a steroid injection.  He has my card if he needs to get in touch with [place].  Follow-Up Instructions: No follow-ups on file.   Orders:  No orders of the defined types were placed in this encounter.  No orders of the defined types were placed in this encounter.     Procedures: No procedures performed   Clinical Data: No additional findings.   Subjective: Chief Complaint  Patient presents with   Left Shoulder - Pain    HPI Dustin Powell is a 62 year old gentleman here for evaluation of left shoulder pain for a month.  Denies any injuries.  Reports pain and swelling directly over the [facility] joint that radiates up into the neck.  Worse when he is laying on the left side at night and worse when he is exercising. Review of Systems  Constitutional: Negative.   HENT: Negative.    Eyes: Negative.   Respiratory: Negative.    Cardiovascular: Negative.   Gastrointestinal: Negative.   Endocrine: Negative.   Genitourinary: Negative.   Skin: Negative.   Allergic/Immunologic: Negative.   Neurological: Negative.   Hematological: Negative.   Psychiatric/Behavioral: Negative.    All other systems reviewed and are negative.    Objective: Vital Signs: There were no vitals taken for this visit.  Physical  Exam Vitals and nursing note reviewed.  Constitutional:      Appearance: He is well-developed.  HENT:     Head: Normocephalic and atraumatic.  Eyes:     Pupils: Pupils are equal, round, and reactive to light.  Pulmonary:     Effort: Pulmonary effort is normal.  Abdominal:     Palpations: Abdomen is soft.  Musculoskeletal:        General: Normal range of motion.     Cervical back: Neck supple.  Skin:    General: Skin is warm.  Neurological:     Mental Status: He is alert and oriented to person, place, and time.  Psychiatric:        Behavior: Behavior normal.        Thought Content: Thought content normal.        Judgment: Judgment normal.     Ortho Exam Examination of the left shoulder girdle shows swelling and prominence of the [facility] joint with tenderness.  There is no instability to the joint.  Shoulder range of motion is normal.  Manual muscle testing of the rotator cuff is normal. Specialty Comments:  No specialty comments available.  Imaging: X-rays independently interpreted and reviewed from March shows mild degenerative changes of the left [place] joint.   PMFS History: Patient Active Problem List   Diagnosis Date Noted  Arthritis of left sternoclavicular joint 05/24/2023   Primary hypertension 10/16/2022   Erectile dysfunction 10/16/2022   Fever    Past Medical History:  Diagnosis Date   Erectile dysfunction    Herpes genitalia     Family History  Problem Relation Age of Onset   Colon polyps Neg Hx    Colon cancer Neg Hx    Esophageal cancer Neg Hx    Stomach cancer Neg Hx    Rectal cancer Neg Hx     Past Surgical History:  Procedure Laterality Date   SALIVARY STONE REMOVAL  07/2021   Social History   Occupational History   Not on file  Tobacco Use   Smoking status: Never   Smokeless tobacco: Never  Vaping Use   Vaping status: Never Used  Substance and Sexual Activity   Alcohol use: Yes    Alcohol/week: 6.0 standard drinks of alcohol    Types: 6  Cans of beer per week    Comment: weekends 6 pack   Drug use: Never   Sexual activity: Not on file

## 2023-10-10 ENCOUNTER — Other Ambulatory Visit: Payer: Self-pay | Admitting: Family

## 2023-10-10 DIAGNOSIS — N529 Male erectile dysfunction, unspecified: Secondary | ICD-10-CM

## 2023-10-11 ENCOUNTER — Encounter: Payer: Managed Care, Other (non HMO) | Admitting: Family

## 2023-10-11 NOTE — Progress Notes (Signed)
   This encounter was created in error - please disregard. No show

## 2023-10-17 ENCOUNTER — Encounter: Payer: Managed Care, Other (non HMO) | Admitting: Family

## 2023-10-17 NOTE — Progress Notes (Signed)
  This encounter was created in error - please disregard. °Appointment cancelled. °

## 2023-11-20 ENCOUNTER — Telehealth: Payer: Self-pay

## 2023-11-20 DIAGNOSIS — N529 Male erectile dysfunction, unspecified: Secondary | ICD-10-CM

## 2023-11-20 DIAGNOSIS — B009 Herpesviral infection, unspecified: Secondary | ICD-10-CM

## 2023-11-20 MED ORDER — ACYCLOVIR 400 MG PO TABS: 400.0000 mg | ORAL_TABLET | Freq: Two times a day (BID) | ORAL | 1 refills | Status: AC

## 2023-11-20 MED ORDER — TADALAFIL 20 MG PO TABS: ORAL_TABLET | ORAL | 0 refills | Status: DC

## 2023-11-20 NOTE — Telephone Encounter (Signed)
 Okay to refill Acyclovir as requested by patient # [phone] until he establishes with another provider.

## 2023-11-20 NOTE — Telephone Encounter (Signed)
 Patient called and left message on Clinical Intake requesting a refill on his Acyclovir.  Patient stated that he has moved from [facility] to [place] and needed a refill until he can get to see his new Dr.   I tried calling patient because I did not understand which pharmacy in [place] that he needs it sent to . Could not leave message on voicemail due to not set up.

## 2023-11-21 NOTE — Telephone Encounter (Signed)
 Beth[place] sent Rx to pharmacy yesterday 11/21/2023 with confirmation from pharmacy.

## 2024-03-23 ENCOUNTER — Other Ambulatory Visit: Payer: Self-pay | Admitting: Family

## 2024-03-23 DIAGNOSIS — N529 Male erectile dysfunction, unspecified: Secondary | ICD-10-CM

## 2024-03-25 NOTE — Telephone Encounter (Signed)
 Patient needs to be seen in the office and have labs done. Pls tell him to schedule a visit.

## 2024-03-25 NOTE — Telephone Encounter (Signed)
 Patient hasn't been seen in 6 months and labs haven't been done in a year. Unsure how to go about this medication approval regarding the circumstances.

## 2024-03-26 NOTE — Telephone Encounter (Signed)
 Tried calling number on file twice and states that the number dialed is not reachable We need to make another attempt later.

## 2024-03-27 ENCOUNTER — Other Ambulatory Visit: Payer: Self-pay

## 2024-03-27 DIAGNOSIS — N529 Male erectile dysfunction, unspecified: Secondary | ICD-10-CM

## 2024-03-27 MED ORDER — TADALAFIL 20 MG PO TABS: ORAL_TABLET | ORAL | 0 refills | Status: DC

## 2024-06-01 ENCOUNTER — Other Ambulatory Visit: Payer: Self-pay | Admitting: Family

## 2024-06-01 DIAGNOSIS — N529 Male erectile dysfunction, unspecified: Secondary | ICD-10-CM

## 2024-09-05 ENCOUNTER — Other Ambulatory Visit: Payer: Self-pay | Admitting: Family

## 2024-09-05 DIAGNOSIS — N529 Male erectile dysfunction, unspecified: Secondary | ICD-10-CM

## 2024-09-06 NOTE — Telephone Encounter (Signed)
 Please schedule follow-up appointment

## 2024-09-06 NOTE — Telephone Encounter (Signed)
 Not seen in the last 12 months

## 2024-09-09 NOTE — Telephone Encounter (Signed)
 Will send to the admin staff to schedule patient an appointment. Once appointment is scheduled route back to me and I will send prescription for 30 day supply (allowing time to be seen)

## 2024-09-09 NOTE — Telephone Encounter (Signed)
 Called patient to get scheduled for f/u. Unable to leave vm. Will try to reach him again.

## 2024-09-10 NOTE — Telephone Encounter (Signed)
 Called patient to get scheduled for f/u. Unable to leave vm.

## 2024-09-27 ENCOUNTER — Other Ambulatory Visit: Payer: Self-pay | Admitting: Family

## 2024-09-27 DIAGNOSIS — N529 Male erectile dysfunction, unspecified: Secondary | ICD-10-CM

## 2024-09-27 DIAGNOSIS — B009 Herpesviral infection, unspecified: Secondary | ICD-10-CM

## 2024-09-27 NOTE — Telephone Encounter (Signed)
 Copied from CRM [id number]. Topic: Clinical - Medication Refill >> Sep 27, 2024  4:55 PM Debby BROCKS wrote: Medication: acyclovir  (ZOVIRAX ) 400 MG table  Has the patient contacted their pharmacy? Yes (Agent: If no, request that the patient contact the pharmacy for the refill. If patient does not wish to contact the pharmacy document the reason why and proceed with request.) (Agent: If yes, when and what did the pharmacy advise?) Patient has recently moved and pharmacy advised that he needs to contact PCP  This is the patient's preferred pharmacy:  [facility] [address] [place] [address] [place] - [address] [place]) [place] [address] Phone: [phone] Fax: [phone]  Is this the correct pharmacy for this prescription? Yes If no, delete pharmacy and type the correct one.   Has the prescription been filled recently? No  Is the patient out of the medication? Yes  Has the patient been seen for an appointment in the last year OR does the patient have an upcoming appointment? Yes  Can we respond through MyChart? Yes  Agent: Please be advised that Rx refills may take up to 3 business days. We ask that you follow-up with your pharmacy.

## 2024-09-27 NOTE — Telephone Encounter (Signed)
 Copied from CRM [id number]. Topic: Clinical - Medication Refill >> Sep 27, 2024  4:52 PM Debby BROCKS wrote: Medication: Tadalafil  (CIALIS ) 20 MG tablet  Has the patient contacted their pharmacy? Yes (Agent: If no, request that the patient contact the pharmacy for the refill. If patient does not wish to contact the pharmacy document the reason why and proceed with request.) (Agent: If yes, when and what did the pharmacy advise?)  This is the patient's preferred pharmacy:  [facility] [address] [place]), [place] - [address] [place]) [place] [address] Phone: [phone] Fax: [phone]  Is this the correct pharmacy for this prescription? No If no, delete pharmacy and type the correct one.   Has the prescription been filled recently? No  Is the patient out of the medication? Yes  Has the patient been seen for an appointment in the last year OR does the patient have an upcoming appointment? Yes  Can we respond through MyChart? Yes  Agent: Please be advised that Rx refills may take up to 3 business days. We ask that you follow-up with your pharmacy.

## 2024-09-30 MED ORDER — TADALAFIL 20 MG PO TABS: 20.0000 mg | ORAL_TABLET | Freq: Every day | ORAL | 0 refills | Status: AC | PRN
